# Patient Record
Sex: Male | Born: 1965
Health system: Southern US, Community
[De-identification: ages and names within clinical notes are randomized; demographics above are authoritative.]

## PROBLEM LIST (undated history)

## (undated) DIAGNOSIS — I472 Ventricular tachycardia: Secondary | ICD-10-CM

## (undated) DIAGNOSIS — F419 Anxiety disorder, unspecified: Secondary | ICD-10-CM

## (undated) DIAGNOSIS — I4891 Unspecified atrial fibrillation: Secondary | ICD-10-CM

## (undated) DIAGNOSIS — Z720 Tobacco use: Secondary | ICD-10-CM

## (undated) DIAGNOSIS — I493 Ventricular premature depolarization: Secondary | ICD-10-CM

## (undated) DIAGNOSIS — T7840XA Allergy, unspecified, initial encounter: Secondary | ICD-10-CM

## (undated) DIAGNOSIS — I4729 Other ventricular tachycardia: Secondary | ICD-10-CM

## (undated) DIAGNOSIS — I480 Paroxysmal atrial fibrillation: Secondary | ICD-10-CM

## (undated) HISTORY — DX: Ventricular tachycardia: I47.2

## (undated) HISTORY — DX: Tobacco use: Z72.0

## (undated) HISTORY — PX: WISDOM TOOTH EXTRACTION: SHX21

## (undated) HISTORY — DX: Paroxysmal atrial fibrillation: I48.0

## (undated) HISTORY — DX: Ventricular premature depolarization: I49.3

## (undated) HISTORY — DX: Other ventricular tachycardia: I47.29

## (undated) HISTORY — DX: Anxiety disorder, unspecified: F41.9

## (undated) HISTORY — PX: HERNIA REPAIR: SHX51

## (undated) HISTORY — DX: Allergy, unspecified, initial encounter: T78.40XA

---

## 2004-01-27 ENCOUNTER — Emergency Department (HOSPITAL_COMMUNITY): Admission: EM | Admit: 2004-01-27 | Discharge: 2004-01-28 | Payer: Self-pay

## 2009-08-20 ENCOUNTER — Encounter: Admission: RE | Admit: 2009-08-20 | Discharge: 2009-08-20 | Payer: Self-pay | Admitting: Internal Medicine

## 2011-08-28 ENCOUNTER — Ambulatory Visit (INDEPENDENT_AMBULATORY_CARE_PROVIDER_SITE_OTHER): Payer: 59 | Admitting: Family Medicine

## 2011-08-28 VITALS — BP 138/87 | HR 70 | Temp 97.9°F | Resp 16 | Ht 71.5 in | Wt 211.0 lb

## 2011-08-28 DIAGNOSIS — Z23 Encounter for immunization: Secondary | ICD-10-CM

## 2011-08-28 DIAGNOSIS — S61409A Unspecified open wound of unspecified hand, initial encounter: Secondary | ICD-10-CM

## 2011-08-28 DIAGNOSIS — S60569A Insect bite (nonvenomous) of unspecified hand, initial encounter: Secondary | ICD-10-CM

## 2011-08-28 DIAGNOSIS — T63331A Toxic effect of venom of brown recluse spider, accidental (unintentional), initial encounter: Secondary | ICD-10-CM

## 2011-08-28 DIAGNOSIS — F17200 Nicotine dependence, unspecified, uncomplicated: Secondary | ICD-10-CM

## 2011-08-28 DIAGNOSIS — L299 Pruritus, unspecified: Secondary | ICD-10-CM

## 2011-08-28 MED ORDER — CETIRIZINE HCL 10 MG PO TABS: ORAL_TABLET | ORAL | Status: DC

## 2011-08-28 MED ORDER — RANITIDINE HCL 300 MG PO TABS: 300.0000 mg | ORAL_TABLET | Freq: Every day | ORAL | Status: DC

## 2011-08-28 MED ORDER — CEPHALEXIN 500 MG PO CAPS: 500.0000 mg | ORAL_CAPSULE | Freq: Two times a day (BID) | ORAL | Status: AC

## 2011-08-28 NOTE — Progress Notes (Signed)
  Subjective:    Patient ID: Nathan Harrison, male    DOB: Oct 08, 1965, 46 y.o.   MRN: [id number]  HPI 46 YO cm PRESENTS WITH PRURITIC BLISTER IN l HAND.  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  Doesn't remember any injury.  Didn't see any unusual spiders.    Onset: 3-4 days ago Quality:  Itching Duration: 3-4 days ago. Timing:  Constant Assc S/Sx=hand feels tight, no pain.   Review of Systems  Constitutional: Negative.   Gastrointestinal: Negative.   Neurological: Negative.   All other systems reviewed and are negative.       Objective:   Physical Exam  Constitutional: He appears well-developed and well-nourished.  HENT:  Head: Normocephalic and atraumatic.  Neck: Normal range of motion.  Cardiovascular: Normal rate, regular rhythm and normal heart sounds.   Pulmonary/Chest: Effort normal and breath sounds normal.  Skin: Skin is warm and dry.       See photo        Assessment & Plan:  Bullae ? Spider bite vs. other

## 2011-08-28 NOTE — Patient Instructions (Signed)
Return to clinic immediately if red streaks develop, fever, skin appears to turn black, or pain develops.

## 2011-08-29 NOTE — Progress Notes (Signed)
Patient seen and examined with [facility]  Bullous blood filled bleb. Agree with treatment plan.

## 2011-08-30 ENCOUNTER — Emergency Department (HOSPITAL_BASED_OUTPATIENT_CLINIC_OR_DEPARTMENT_OTHER)
Admission: EM | Admit: 2011-08-30 | Discharge: 2011-08-31 | Disposition: A | Discharge status: Home / Self Care | Payer: 59 | Attending: Emergency Medicine | Admitting: Emergency Medicine

## 2011-08-30 ENCOUNTER — Encounter (HOSPITAL_BASED_OUTPATIENT_CLINIC_OR_DEPARTMENT_OTHER): Payer: Self-pay | Admitting: *Deleted

## 2011-08-30 DIAGNOSIS — T63391A Toxic effect of venom of other spider, accidental (unintentional), initial encounter: Secondary | ICD-10-CM | POA: Insufficient documentation

## 2011-08-30 DIAGNOSIS — T63301A Toxic effect of unspecified spider venom, accidental (unintentional), initial encounter: Secondary | ICD-10-CM

## 2011-08-30 DIAGNOSIS — T07XXXA Unspecified multiple injuries, initial encounter: Secondary | ICD-10-CM | POA: Insufficient documentation

## 2011-08-30 NOTE — ED Notes (Signed)
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 Last Thursday, he was seen at [facility] and ?diagnosed with a "Brown Recluse bite" to the palm of his left hand. Placed on Keflex, Zyrtec and Zantac. Now presents with a purplish blister 2-1/2 cm x 1-1/2 cm to the palm of his hand. Red around circumference. Pt states this started off as an itch, then 2 days later a blister, then this.

## 2011-08-31 NOTE — ED Provider Notes (Signed)
History    Scribed for Hanley Seamen, MD, the patient was seen in room MHH1/MHH1. This chart was scribed by Katha Cabal.   CSN: [id number]  Arrival date & time 08/30/11  2149   First MD Initiated Contact with Patient 08/31/11 0028      Chief Complaint  Patient presents with  . Insect Bite    (Consider location/radiation/quality/duration/timing/severity/associated sxs/prior treatment) HPI    Nathan Harrison is a 46 y.o. male who presents to the Emergency Department complaining of gradual worsening of moderate insect bite that was diagnosed 2 days ago at [place] as a brown recluse spider bite.    Patient reports itching.  Blister has grown in size the past 2 days.   Patient concerned about MRSA.  Patient unsure of how blister began.  Patient states he typically wears gloves but recalls not wearing gloves during particular unsanitary episode at work.   Patient noticed blister 2 days after incident.    History reviewed. No pertinent past medical history.  Past Surgical History  Procedure Date  . Wisdom tooth extraction   . Hernia repair     History reviewed. No pertinent family history.  History  Substance Use Topics  . Smoking status: Never Smoker   . Smokeless tobacco: Current User    Types: Snuff  . Alcohol Use: 7.2 oz/week    12 Cans of beer per week      Review of Systems  All other systems reviewed and are negative.    Allergies  Tuberculin tests  Home Medications   Current Outpatient Rx  Name Route Sig Dispense Refill  . CEPHALEXIN 500 MG PO CAPS Oral Take 1 capsule (500 mg total) by mouth 2 (two) times daily. 14 capsule 0  . CETIRIZINE HCL 10 MG PO TABS Oral Take 10 mg by mouth daily. For 2 weeks starting 08/29/11    . RANITIDINE HCL 300 MG PO TABS Oral Take 1 tablet (300 mg total) by mouth at bedtime. 30 tablet 0    BP 149/93  Pulse 82  Temp(Src) 98.5 F (36.9 C) (Oral)  Resp 20  Ht 5' 11.5" (1.816 m)  Wt 210 lb (95.255 kg)  BMI 28.88 kg/m2   SpO2 100%  Physical Exam General: Well-developed, well-nourished male in no acute distress; appearance consistent with age of record HENT: normocephalic, atraumatic Eyes: pupils equal round and reactive to light; extraocular muscles intact Neck: supple Heart: regular rate and rhythm; no murmurs, rubs or gallops Lungs: clear to auscultation bilaterally Abdomen: soft; nondistended; nontender; no masses or hepatosplenomegaly; bowel sounds present Extremities: No deformity; full range of motion; pulses normal Neurologic: Awake, alert and oriented; motor function intact in all extremities and symmetric; no facial droop Skin: Warm and dry, hemorrhagic bulla of palm of left hand with mild surrounding erythema but no warmth   Psychiatric: Normal mood and affect   ED Course  Procedures (including critical care time)   NEEDLE DRAINAGE Bulla prepped with Chlorascrub.  Bulla was punctured with 22 gauge needle.  Serous drainage obtained, there was no pus.     DIAGNOSTIC STUDIES: Oxygen Saturation is 100% on room air, normal by my interpretation.     COORDINATION OF CARE: 12:33 AM  Physical exam complete.   12:37 AM  Needle Drainage procedure tolerated well. 12:40 AM  Plan to discharge patient with spider bite.  Patient agrees with plan.       LABS / RADIOLOGY:   Labs Reviewed - No data to display No results  found.       MDM  I personally performed the services described in this documentation, which was scribed in my presence.  The recorded information has been reviewed and considered.        IMPRESSION: 1. Spider bite               Hanley Seamen, MD 08/31/11 [phone]

## 2012-06-25 ENCOUNTER — Ambulatory Visit (INDEPENDENT_AMBULATORY_CARE_PROVIDER_SITE_OTHER): Payer: 59 | Admitting: Emergency Medicine

## 2012-06-25 VITALS — BP 133/82 | HR 73 | Temp 98.0°F | Resp 16 | Ht 72.0 in | Wt 210.6 lb

## 2012-06-25 DIAGNOSIS — K4090 Unilateral inguinal hernia, without obstruction or gangrene, not specified as recurrent: Secondary | ICD-10-CM

## 2012-06-25 NOTE — Progress Notes (Signed)
[facility] [address], [place] [address] [phone]  Date:  06/25/2012   Name:  Nathan Harrison   DOB:  1965-11-29   MRN:  [id number]  PCP:  No primary provider on file.    Chief Complaint: Hernia   History of Present Illness:  Nathan Harrison is a 46 y.o. very pleasant male patient who presents with the following:  Last night developed a pain in his right groin after completing a workout.  Has a funny swollen feeling.  No testicular or scrotal involvement  There is no problem list on file for this patient.   Past Medical History  Diagnosis Date  . Allergy     Past Surgical History  Procedure Date  . Wisdom tooth extraction   . Hernia repair     History  Substance Use Topics  . Smoking status: Never Smoker   . Smokeless tobacco: Current User    Types: Snuff  . Alcohol Use: 7.2 oz/week    12 Cans of beer per week    Family History  Problem Relation Age of Onset  . Arthritis Mother   . Arthritis Father   . Hyperlipidemia Father   . Arthritis Brother     Allergies  Allergen Reactions  . Tuberculin Tests Other (See Comments)    Had adverse reaction in college    Medication list has been reviewed and updated.  Current Outpatient Prescriptions on File Prior to Visit  Medication Sig Dispense Refill  . cetirizine (ZYRTEC) 10 MG tablet Take 10 mg by mouth daily. For 2 weeks starting 08/29/11      . ranitidine (ZANTAC) 300 MG tablet Take 1 tablet (300 mg total) by mouth at bedtime.  30 tablet  0    Review of Systems:  As per HPI, otherwise negative.    Physical Examination: Filed Vitals:   06/25/12 1051  BP: 133/82  Pulse: 73  Temp: 98 F (36.7 C)  Resp: 16   Filed Vitals:   06/25/12 1051  Height: 6' (1.829 m)  Weight: 210 lb 9.6 oz (95.528 kg)   Body mass index is 28.56 kg/(m^2). Ideal Body Weight: Weight in (lb) to have BMI = 25: 183.9    GEN: WDWN, NAD, Non-toxic, Alert & Oriented x 3 HEENT: Atraumatic,  Normocephalic.  Ears and Nose: No external deformity. EXTR: No clubbing/cyanosis/edema NEURO: Normal gait.  PSYCH: Normally interactive. Conversant. Not depressed or anxious appearing.  Calm demeanor.  Genitalia: normal male circumcised.  Right inguinal hernia  Assessment and Plan: Inguinal hernia Surgical referral  Carmelina Dane, MD

## 2012-06-25 NOTE — Patient Instructions (Signed)

## 2012-06-28 ENCOUNTER — Telehealth: Payer: Self-pay

## 2012-06-28 ENCOUNTER — Encounter (HOSPITAL_BASED_OUTPATIENT_CLINIC_OR_DEPARTMENT_OTHER): Payer: Self-pay | Admitting: *Deleted

## 2012-06-28 ENCOUNTER — Emergency Department (HOSPITAL_BASED_OUTPATIENT_CLINIC_OR_DEPARTMENT_OTHER)
Admission: EM | Admit: 2012-06-28 | Discharge: 2012-06-28 | Disposition: A | Discharge status: Home / Self Care | Payer: Worker's Compensation | Attending: Emergency Medicine | Admitting: Emergency Medicine

## 2012-06-28 DIAGNOSIS — K4090 Unilateral inguinal hernia, without obstruction or gangrene, not specified as recurrent: Secondary | ICD-10-CM | POA: Insufficient documentation

## 2012-06-28 LAB — URINALYSIS, ROUTINE W REFLEX MICROSCOPIC
Glucose, UA: 100 mg/dL — AB
Ketones, ur: NEGATIVE mg/dL
Leukocytes, UA: NEGATIVE
Nitrite: NEGATIVE
Protein, ur: NEGATIVE mg/dL
Urobilinogen, UA: 0.2 mg/dL (ref 0.0–1.0)

## 2012-06-28 NOTE — ED Notes (Signed)
While at work on Thursday December 5th he was lifting a steel door and that night his right groin was swollen and painful. He was seen by his MD the next day for abdominal pain and inguinal hernia swelling. Today while he was at work he noticed swelling in his abdomen.

## 2012-06-28 NOTE — ED Notes (Signed)
MD at bedside. 

## 2012-06-28 NOTE — ED Provider Notes (Signed)
History  This chart was scribed for Charles B. Bernette Mayers, MD by Ardeen Jourdain, ED Scribe. This patient was seen in room MH09/MH09 and the patient's care was started at 2214.  CSN: [id number]  Arrival date & time 06/28/12  2155   First MD Initiated Contact with Patient 06/28/12 2214      Chief Complaint  Patient presents with  . Abdominal Pain     The history is provided by the patient. No language interpreter was used.    Nathan Harrison is a 46 y.o. male who presents to the Emergency Department complaining of abdominal pain with associated swelling of the groin area, right sided pain and abdominal tightness. He denies nausea and emesis as associated symptoms. He reports the pain has been gradually worsening today and is aggravated by all types of movement. He states the pain started 5 days ago in the evening after lifting heavy objects at work. He states he was seen at [facility]gent [facility] the next day for the pain and was diagnosed with a hernia. He states the physician "pushed the hernia back in" and reccommended follow up with a surgeon is the pain worsens or new symptoms appear.   Past Medical History  Diagnosis Date  . Allergy     Past Surgical History  Procedure Date  . Wisdom tooth extraction   . Hernia repair     Family History  Problem Relation Age of Onset  . Arthritis Mother   . Arthritis Father   . Hyperlipidemia Father   . Arthritis Brother     History  Substance Use Topics  . Smoking status: Never Smoker   . Smokeless tobacco: Current User    Types: Snuff  . Alcohol Use: 7.2 oz/week    12 Cans of beer per week      Review of Systems  All other systems reviewed and are negative.  A complete 10 system review of systems was obtained and all systems are negative except as noted in the HPI and PMH.    Allergies  Tuberculin tests  Home Medications   Current Outpatient Rx  Name  Route  Sig  Dispense  Refill  . CETIRIZINE HCL 10 MG PO TABS   Oral   Take  10 mg by mouth daily. For 2 weeks starting 08/29/11         . RANITIDINE HCL 300 MG PO TABS   Oral   Take 1 tablet (300 mg total) by mouth at bedtime.   30 tablet   0     Triage Vitals: BP 143/90  Pulse 99  Temp 98.2 F (36.8 C) (Oral)  Resp 18  SpO2 98%  Physical Exam  Nursing note and vitals reviewed. Constitutional: He is oriented to person, place, and time. He appears well-developed and well-nourished.  HENT:  Head: Normocephalic and atraumatic.  Eyes: EOM are normal. Pupils are equal, round, and reactive to light.  Neck: Normal range of motion. Neck supple.  Cardiovascular: Normal rate, regular rhythm, normal heart sounds and intact distal pulses.   Pulmonary/Chest: Effort normal and breath sounds normal.  Abdominal: Soft. Bowel sounds are normal. He exhibits no distension. There is no tenderness. There is no rebound and no guarding.  Genitourinary:       Mild tenderness to R testicle without significant swelling, there is a small R inguinal hernia without bowel contents, no incarceration  Musculoskeletal: Normal range of motion. He exhibits no edema and no tenderness.  Neurological: He is alert and oriented  to person, place, and time. He has normal strength. No cranial nerve deficit or sensory deficit.  Skin: Skin is warm and dry. No rash noted.  Psychiatric: He has a normal mood and affect. His behavior is normal.    ED Course  Procedures (including critical care time)  DIAGNOSTIC STUDIES: Oxygen Saturation is 98% on room air , normal by my interpretation.    COORDINATION OF CARE:  10:23 PM: Discussed treatment plan which includes a urinalysis with pt at bedside and pt agreed to plan.   Results for orders placed during the hospital encounter of 06/28/12  URINALYSIS, ROUTINE W REFLEX MICROSCOPIC      Component Value Range   Color, Urine YELLOW  YELLOW   APPearance CLEAR  CLEAR   Specific Gravity, Urine 1.024  1.005 - 1.030   pH 5.0  5.0 - 8.0   Glucose, UA  100 (*) NEGATIVE mg/dL   Hgb urine dipstick NEGATIVE  NEGATIVE   Bilirubin Urine NEGATIVE  NEGATIVE   Ketones, ur NEGATIVE  NEGATIVE mg/dL   Protein, ur NEGATIVE  NEGATIVE mg/dL   Urobilinogen, UA 0.2  0.0 - 1.0 mg/dL   Nitrite NEGATIVE  NEGATIVE   Leukocytes, UA NEGATIVE  NEGATIVE    No results found.   No diagnosis found.    MDM  Inguinal hernia without incarceration, advised Gen Surg followup for further evaluation and management.     I personally performed the services described in this documentation, which was scribed in my presence. The recorded information has been reviewed and is accurate.     Charles B. Bernette Mayers, MD 06/28/12 [phone]

## 2012-06-28 NOTE — Telephone Encounter (Signed)
Patient has some questions and concerns about hernia. He was told to use best judgement when lifting at work and go to ER if things get worse, he would like a bit more clarification on this. He is wondering what to do while he waits for surgery to be scheduled because he still needs to work.  Best [phone]

## 2012-06-28 NOTE — Progress Notes (Signed)
Reviewed and agree.

## 2012-06-29 ENCOUNTER — Ambulatory Visit: Payer: 59 | Admitting: Family Medicine

## 2012-06-29 VITALS — BP 122/84 | HR 77 | Temp 98.0°F | Resp 16 | Ht 72.0 in | Wt 210.0 lb

## 2012-06-29 DIAGNOSIS — K4090 Unilateral inguinal hernia, without obstruction or gangrene, not specified as recurrent: Secondary | ICD-10-CM

## 2012-06-29 NOTE — Patient Instructions (Signed)
Nathan Harrison is a 46 y.o. very pleasant male patient who presents with the following:  Worked in [employer]. 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  He recalls lifting a heavy door and this is when the problem began.  Last night developed a pain in his right groin after completing a workout. Has a funny swollen feeling. No testicular or scrotal involvement  He has been trying to take it easy at work, and has been checking himself frequently and trying to reduce the hernia during the day.  He is worried about worsening hernia because of frequent urination and right (ipsilateral) flank pain.  He went to the ED last night.  The hernia was confirmed.  S/P double hernia repair as child in [place].  Objective:  We discussed the nature of hernias. Right hernia barely palpable  Assessment:  Right inguinal hernia.  Plan: surgical referral on worker's comp

## 2012-06-29 NOTE — Telephone Encounter (Signed)
Pt came into 102 this morning to be seen and he is here now for recheck and to get instr's from provider.

## 2012-06-29 NOTE — Progress Notes (Signed)
Nathan Harrison is a 46 y.o. 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  He recalls lifting a heavy door and this is when the problem began.  Last night developed a pain in his right groin after completing a workout. Has a funny swollen feeling. No testicular or scrotal involvement  He has been trying to take it easy at work, and has been checking himself frequently and trying to reduce the hernia during the day.  He is worried about worsening hernia because of frequent urination and right (ipsilateral) flank pain.  He went to the ED last night.  The hernia was confirmed.  S/P double hernia repair as child in [place].  Objective:  We discussed the nature of hernias. Right hernia barely palpable  Assessment:  Right inguinal hernia.  Plan: surgical referral on worker's comp 

## 2012-07-01 ENCOUNTER — Encounter (INDEPENDENT_AMBULATORY_CARE_PROVIDER_SITE_OTHER): Payer: Self-pay | Admitting: Surgery

## 2012-07-02 ENCOUNTER — Ambulatory Visit (INDEPENDENT_AMBULATORY_CARE_PROVIDER_SITE_OTHER): Payer: Worker's Compensation | Admitting: Surgery

## 2012-07-02 ENCOUNTER — Encounter (INDEPENDENT_AMBULATORY_CARE_PROVIDER_SITE_OTHER): Payer: Self-pay | Admitting: Surgery

## 2012-07-02 VITALS — BP 127/86 | HR 80 | Temp 98.2°F | Resp 14 | Ht 69.0 in | Wt 211.8 lb

## 2012-07-02 DIAGNOSIS — K4020 Bilateral inguinal hernia, without obstruction or gangrene, not specified as recurrent: Secondary | ICD-10-CM | POA: Insufficient documentation

## 2012-07-02 DIAGNOSIS — K429 Umbilical hernia without obstruction or gangrene: Secondary | ICD-10-CM

## 2012-07-02 NOTE — Progress Notes (Signed)
Patient ID: Nathan Harrison, male   DOB: [birth year], 46 y.o.   MRN: [id number]  Chief Complaint  Patient presents with  . Other    possible right inguinal hernia    HPI Nathan Harrison is a 46 y.o. male.   HPI This is a very pleasant gentleman referred by Dr. Milus Glazier for evaluation of a symptomatic right inguinal hernia. He started having swelling in the right groin and pain after heavy lifting on the job. He then noticed a bulge that could be reduced.  This was confirmed by his primary care physician. He has had no obstructive symptoms. He reports minimal discomfort now. He had bilateral inguinal hernia repairs as a baby. He is otherwise healthy Past Medical History  Diagnosis Date  . Allergy     Past Surgical History  Procedure Date  . Wisdom tooth extraction   . Hernia repair     Family History  Problem Relation Age of Onset  . Arthritis Mother   . Arthritis Father   . Hyperlipidemia Father   . Arthritis Brother     Social History History  Substance Use Topics  . Smoking status: Never Smoker   . Smokeless tobacco: Current User    Types: Snuff  . Alcohol Use: 7.2 oz/week    12 Cans of beer per week    Allergies  Allergen Reactions  . Tuberculin Tests Other (See Comments)    Had adverse reaction in college    No current outpatient prescriptions on file.    Review of Systems Review of Systems  Constitutional: Negative for fever, chills and unexpected weight change.  HENT: Negative for hearing loss, congestion, sore throat, trouble swallowing and voice change.   Eyes: Negative for visual disturbance.  Respiratory: Negative for cough and wheezing.   Cardiovascular: Negative for chest pain, palpitations and leg swelling.  Gastrointestinal: Negative for nausea, vomiting, abdominal pain, diarrhea, constipation, blood in stool, abdominal distention, anal bleeding and rectal pain.  Genitourinary: Negative for hematuria and difficulty urinating.   Musculoskeletal: Negative for arthralgias.  Skin: Negative for rash and wound.  Neurological: Negative for seizures, syncope, weakness and headaches.  Hematological: Negative for adenopathy. Does not bruise/bleed easily.  Psychiatric/Behavioral: Negative for confusion.    Blood pressure 127/86, pulse 80, temperature 98.2 F (36.8 C), temperature source Temporal, resp. rate 14, height 5\' 9"  (1.753 m), weight 211 lb 12.8 oz (96.072 kg).  Physical Exam Physical Exam  Constitutional: He is oriented to person, place, and time. He appears well-developed and well-nourished. No distress.  HENT:  Head: Normocephalic and atraumatic.  Right Ear: External ear normal.  Left Ear: External ear normal.  Nose: Nose normal.  Mouth/Throat: Oropharynx is clear and moist. No oropharyngeal exudate.  Eyes: Conjunctivae normal are normal. Pupils are equal, round, and reactive to light. Right eye exhibits no discharge. Left eye exhibits no discharge. No scleral icterus.  Neck: Normal range of motion. Neck supple. No tracheal deviation present. No thyromegaly present.  Cardiovascular: Normal rate, regular rhythm, normal heart sounds and intact distal pulses.   No murmur heard. Pulmonary/Chest: Effort normal and breath sounds normal. No respiratory distress. He has no wheezes. He has no rales.  Abdominal: Soft. Bowel sounds are normal. He exhibits no distension. There is no tenderness. There is no guarding.       He has a moderate-sized, reducible right inguinal hernia. There is a very small left inguinal hernia. He also has a chronically incarcerated umbilical hernia  Musculoskeletal: Normal range of motion.  He exhibits no edema and no tenderness.  Lymphadenopathy:    He has no cervical adenopathy.  Neurological: He is alert and oriented to person, place, and time.  Skin: Skin is warm and dry. No rash noted. He is not diaphoretic. No erythema.  Psychiatric: His behavior is normal. Judgment normal.    Data  Reviewed   Assessment    Bilateral inguinal hernias and umbilical hernia    Plan    Repair with mesh was recommended. I discussed with the laparoscopic and open techniques. As it is bilateral and there is a small hernia at the umbilicus, I would recommend laparoscopic repair with mesh. I discussed the risks of surgery which includes but is not limited to bleeding, infection, chronic pain, nerve entrapment, recurrence, etc. He understands and wishes to proceed. Likelihood of success is good       Marquetta Weiskopf A 07/02/2012, 3:09 PM

## 2012-07-06 ENCOUNTER — Ambulatory Visit (INDEPENDENT_AMBULATORY_CARE_PROVIDER_SITE_OTHER): Payer: 59 | Admitting: Surgery

## 2012-07-28 ENCOUNTER — Telehealth (INDEPENDENT_AMBULATORY_CARE_PROVIDER_SITE_OTHER): Payer: Self-pay | Admitting: General Surgery

## 2012-07-28 DIAGNOSIS — K4020 Bilateral inguinal hernia, without obstruction or gangrene, not specified as recurrent: Secondary | ICD-10-CM

## 2012-07-28 NOTE — Telephone Encounter (Signed)
Called to note a small amount of blood and burning with urination.  I stated this was most likely from catheter plament and that it should get better with time.  i instructed to call back with profuse  Bleeding, passing clots or inability to urinate.

## 2012-07-29 ENCOUNTER — Telehealth (INDEPENDENT_AMBULATORY_CARE_PROVIDER_SITE_OTHER): Payer: Self-pay | Admitting: General Surgery

## 2012-07-29 ENCOUNTER — Telehealth (INDEPENDENT_AMBULATORY_CARE_PROVIDER_SITE_OTHER): Payer: Self-pay

## 2012-07-29 NOTE — Telephone Encounter (Signed)
His wife called about a small amount of blood in urine when starting and stopping voiding.  It is not a significant amount.  I explained that this is most likely from the foley and should resolve by today.  If still having symptoms by this afternoon, I recommended that she call the office again to let us know and get further direction. She agreed to call us later if symptoms still persist.

## 2012-07-29 NOTE — Telephone Encounter (Signed)
I called to check on the pt and schedule a postop appointment.  He said he had spoken to someone about having some blood when he urinates.  I read the chart and agreed with the physician that it was probably irritation from the foley catheter.  I told him it should get better hopefully today.  I asked him to call if it is no better or if there is pain or burning.  He said his thigh is numb and I told him that is normal and will get better.  He asked if he can stand up and walk around or will it mess something up.  I told him walking is definitely good and he should not sit too long and run the risk of blood clots.  He will call with anymore concerns.

## 2012-08-02 ENCOUNTER — Encounter (INDEPENDENT_AMBULATORY_CARE_PROVIDER_SITE_OTHER): Payer: Self-pay | Admitting: Surgery

## 2012-08-02 ENCOUNTER — Ambulatory Visit (INDEPENDENT_AMBULATORY_CARE_PROVIDER_SITE_OTHER): Payer: Worker's Compensation | Admitting: Surgery

## 2012-08-02 VITALS — BP 142/80 | HR 76 | Temp 97.5°F | Resp 16 | Ht 72.0 in | Wt 205.4 lb

## 2012-08-02 DIAGNOSIS — Z09 Encounter for follow-up examination after completed treatment for conditions other than malignant neoplasm: Secondary | ICD-10-CM

## 2012-08-02 NOTE — Progress Notes (Signed)
Subjective:     Patient ID: Nathan Harrison, male   DOB: September 24, 1965, 47 y.o.   MRN: [id number]  HPI He is here today for an early postop visit. He was concerned about some bruising. He is otherwise doing well and is not taking any pain medication  Review of Systems     Objective:   Physical Exam On exam, there is some very mild bruising right laterally. His abdomen is soft and nontender and there is no evidence of recurrent hernias    Assessment:     Patient stable postop    Plan:     He will continue to refrain from heavy lifting. I will see him back on his regular scheduled postop visit

## 2012-08-17 ENCOUNTER — Ambulatory Visit (INDEPENDENT_AMBULATORY_CARE_PROVIDER_SITE_OTHER): Payer: Worker's Compensation | Admitting: Surgery

## 2012-08-17 ENCOUNTER — Encounter (INDEPENDENT_AMBULATORY_CARE_PROVIDER_SITE_OTHER): Payer: Self-pay | Admitting: Surgery

## 2012-08-17 VITALS — BP 120/81 | HR 70 | Temp 97.3°F | Resp 18 | Ht 72.0 in | Wt 206.6 lb

## 2012-08-17 DIAGNOSIS — Z09 Encounter for follow-up examination after completed treatment for conditions other than malignant neoplasm: Secondary | ICD-10-CM

## 2012-08-17 NOTE — Progress Notes (Signed)
Subjective:     Patient ID: Nathan Harrison, male   DOB: 1966/06/05, 47 y.o.   MRN: [id number]  HPI He is here for his first postop visit status post bilateral laparoscopic inguinal hernia repair with mesh approximately 2 weeks ago. He has mild discomfort but is otherwise doing well. He does have some irritation from on his tongue and floor of his mouth from the endotracheal tube  Review of Systems     Objective:   Physical Exam On exam, there is no evidence recurrent hernia    Assessment:     Patient stable postop    Plan:     He may resume normal activity efforts we can. I will see him back as needed

## 2012-08-19 ENCOUNTER — Telehealth (INDEPENDENT_AMBULATORY_CARE_PROVIDER_SITE_OTHER): Payer: Self-pay

## 2012-08-19 NOTE — Telephone Encounter (Signed)
Irving Burton from pharmacy called to clarify dosage on nystatin powder that was written on 1-28 at office visit. I advised her I will send msg to Dr Magnus Ivan and his assistant to get dosage and call her back. Call back # is [phone].

## 2012-08-19 NOTE — Telephone Encounter (Signed)
Whatever pharm recommends for dose of nystatin swish and swallow

## 2012-08-20 ENCOUNTER — Telehealth (INDEPENDENT_AMBULATORY_CARE_PROVIDER_SITE_OTHER): Payer: Self-pay | Admitting: General Surgery

## 2012-08-20 NOTE — Telephone Encounter (Signed)
Called back to the [facility] and spoke to the pharmacy Erin and I told him what Dr Magnus Ivan stated to do whatever the pharmacy on the dosage for the nystatin swish and swallow. Denny Peon will do the 10 ml on the nystatin

## 2012-09-08 ENCOUNTER — Emergency Department (HOSPITAL_BASED_OUTPATIENT_CLINIC_OR_DEPARTMENT_OTHER)
Admission: EM | Admit: 2012-09-08 | Discharge: 2012-09-09 | Disposition: A | Discharge status: Home / Self Care | Payer: 59 | Attending: Emergency Medicine | Admitting: Emergency Medicine

## 2012-09-08 ENCOUNTER — Encounter (HOSPITAL_BASED_OUTPATIENT_CLINIC_OR_DEPARTMENT_OTHER): Payer: Self-pay | Admitting: *Deleted

## 2012-09-08 DIAGNOSIS — R0602 Shortness of breath: Secondary | ICD-10-CM | POA: Insufficient documentation

## 2012-09-08 DIAGNOSIS — Z716 Tobacco abuse counseling: Secondary | ICD-10-CM

## 2012-09-08 DIAGNOSIS — R110 Nausea: Secondary | ICD-10-CM | POA: Insufficient documentation

## 2012-09-08 DIAGNOSIS — R079 Chest pain, unspecified: Secondary | ICD-10-CM | POA: Insufficient documentation

## 2012-09-08 DIAGNOSIS — F17200 Nicotine dependence, unspecified, uncomplicated: Secondary | ICD-10-CM | POA: Insufficient documentation

## 2012-09-08 DIAGNOSIS — R1013 Epigastric pain: Secondary | ICD-10-CM | POA: Insufficient documentation

## 2012-09-08 NOTE — ED Notes (Signed)
Pt c/o Cp pressure with nausea after lengthy walk this pm

## 2012-09-08 NOTE — ED Provider Notes (Signed)
History     CSN: [id number]  Arrival date & time 09/08/12  2219   First MD Initiated Contact with Patient 09/08/12 2349      Chief Complaint  Patient presents with  . Chest Pain    (Consider location/radiation/quality/duration/timing/severity/associated sxs/prior treatment) HPI Nathan Harrison is a 47 y.o. male is no prior history of hypertension, hypercholesterolemia, is not diabetic but does dip tobacco presents with chest pain. Patient arrives with his father, there is no history of coronary artery disease or myocardial infarction in any first degree relatives. Mother has a history of atrial fibrillation or mitral valve prolapse. The pain started earlier approximately 7 to 8:00, the patient had just walked approximately 30 minutes with his son and had no pain with walking. Patient came inside his house and felt queasy prior to dinner, he sat on the couch and watching TV hoping the feeling would pass however he started having some neck pain which then went away and then had some indigestion in the epigastrium and center of his chest he felt like something was crawling up, he felt a dull pain in the center of his chest also describes it as "expanding, tightness, 3-4/10."  He said his hands felt clammy but he was not sweating, and he did feel short of breath during the episode of pain. Patient became concerned and called EMS. Patient was evaluated by EMS and didn't think he require transport. Patient arrived by private vehicle. Patient says the pain lasted for about 10 minutes and got better after EMS arrival. Currently the patient is pain-free. Patient's history is pertinent for bilateral inguinal hernia repairs about 3-4 weeks ago as well as an umbilical hernia repair. Patient denies any hemoptysis or shortness of breath prior to this incident.  Patient says he is bit since being a kid and uses one can a day, drinks about 12 cans of beer per week and denies any other illicit drugs.    Past  Medical History  Diagnosis Date  . Allergy     Past Surgical History  Procedure Laterality Date  . Wisdom tooth extraction    . Hernia repair      Family History  Problem Relation Age of Onset  . Arthritis Mother   . Arthritis Father   . Hyperlipidemia Father   . Arthritis Brother     History  Substance Use Topics  . Smoking status: Never Smoker   . Smokeless tobacco: Current User    Types: Snuff  . Alcohol Use: 7.2 oz/week    12 Cans of beer per week      Review of Systems. At least 10pt or greater review of systems completed and are negative except where specified in the HPI.  Allergies  Tuberculin tests  Home Medications  No current outpatient prescriptions on file.  BP 154/95  Pulse 77  Temp(Src) 98.1 F (36.7 C) (Oral)  Resp 16  Ht 6' (1.829 m)  Wt 200 lb (90.719 kg)  BMI 27.12 kg/m2  SpO2 100%  Physical Exam  Nursing notes reviewed.  Electronic medical record reviewed. VITAL SIGNS:   Filed Vitals:   09/08/12 2227 09/09/12 0302  BP: 154/95 114/77  Pulse: 77 60  Temp: 98.1 F (36.7 C) 98 F (36.7 C)  TempSrc: Oral Oral  Resp: 16 18  Height: 6' (1.829 m)   Weight: 200 lb (90.719 kg)   SpO2: 100% 100%   CONSTITUTIONAL: Awake, oriented x4, appears non-toxic HENT: Atraumatic, normocephalic, oral mucosa pink and moist, airway  patent. Nares patent without drainage. External ears normal. EYES: Conjunctiva clear, EOMI, PERRLA NECK: Trachea midline, non-tender, supple CARDIOVASCULAR: Normal heart rate, Normal rhythm, No murmurs, rubs, gallops PULMONARY/CHEST: Clear to auscultation, no rhonchi, wheezes, or rales. Symmetrical breath sounds. Non-tender. ABDOMINAL: Non-distended, overweight, soft, non-tender - no rebound or guarding.  BS normal. NEUROLOGIC: Non-focal, moving all four extremities, no gross sensory or motor deficits. EXTREMITIES: No clubbing, cyanosis, or edema SKIN: Warm, Dry, No erythema, No rash  Well healed lap scars from surg,  overwieght, o/w unremark  ED Course  Procedures (including critical care time)  Date: 09/09/2012 at 2241  Rate: 71  Rhythm: normal sinus rhythm  QRS Axis: normal  Intervals: normal  ST/T Wave abnormalities: normal  Conduction Disutrbances: none  Narrative Interpretation: unremarkable - normal sinus rhythm no prior available for comparison   Date: 09/09/2012  Rate: 60  Rhythm: normal sinus rhythm  QRS Axis: normal  Intervals: normal  ST/T Wave abnormalities: normal  Conduction Disutrbances: none  Narrative Interpretation: unremarkable - unchanged from prior EKG from earlier this evening   Labs Reviewed  BASIC METABOLIC PANEL - Abnormal; Notable for the following:    Glucose, Bld 114 (*)    GFR calc non Af Amer 89 (*)    All other components within normal limits  TROPONIN I  CBC WITH DIFFERENTIAL  TROPONIN I   Dg Chest 2 View  09/09/2012  *RADIOLOGY REPORT*  Clinical Data: Chest pain  CHEST - 2 VIEW  Comparison: Prior chest x-ray 01/28/2004  Findings: The lungs are well-aerated and free from pulmonary edema, focal airspace consolidation or pulmonary nodule.  Cardiac and mediastinal contours are within normal limits.  No pneumothorax, or pleural effusion. No acute osseous findings.  IMPRESSION:  No acute cardiopulmonary disease.   Original Report Authenticated By: Malachy Moan, M.D.     1. Chest pain   2. Shortness of breath   3. Epigastric pain       MDM  [name] is a 47 y.o. male With history of intermittent indigestion, no history of coronary artery disease, negative family history, presents with chest pain in the center of his chest. The chest pain described is atypical, also been associated with some epigastric pain suggesting possible acid indigestion, esophageal spasm.  The patient however appears concerned that there is something more serious going on, patient is low risk for pulmonary embolism however he has recently had surgery, but did deny any  shortness of breath currently and only had some shortness of breath when his pain was present. We'll obtain a d-dimer to lower posttest probability of PE.  Patient has not been ill recently, and with his chest pain waxing and waning, is not positional it do not think he is suffering from pericarditis, do not think he's got an aortic dissection as he is nontoxic and pain-free at this time. His pressure is stable and within normal limits. Is not tachycardic. Likewise there is no evidence for pneumothorax/tension pneumothorax do not think he's got a perforated viscus. Acute coronary syndrome remains a possibility however I do think this is unlikely at this time. Think this patient's pain is most likely related to gastroesophageal reflux disease or esophageal spasm. He denies any vomiting so do not think this is of Mallory-Weiss syndrome, no history of prior ulcers-he does drink fairly frequently so this could be related to an alcoholic gastritis. There is no relationship to eating I do not think this is biliary disease pancreatitis or functional gastrointestinal pain. Patient did  seize had problems with his neck which would explain the problem he had with his cervical pain earlier. There is no rash evident, do not think any of his pain is related to herpes zoster. Patient's pain is not reproducible I do not think it's related to chest wall pain. There is a possibility that he is would have mitral valve prolapse however he has absolutely no murmurs on physical exam - as such I also think aortic stenosis or hypertrophic obstructive cardiomyopathy or aortic insufficiency are unlikely. No history of productive cough or recent illness again making a pneumonia, pleuritis or bronchitis much less likely.  Patient has had 2 negative EKGs which are perfectly normal, sinus rhythm. Patient has 2 troponins which were both within normal limits with no change. Otherwise labs and chest x-ray as interpreted by a radiologist are  completely normal.  Since being in the emergency department, the patient has been pain-free.  Patient is looking to find a primary care physician in this building and does have a physician he is seen previously at [facility] urgent care. I think this patient is sufficiently low risk for cardiac disease he does not require followup stress testing in 2-3 days.  The only risk factor this patient has his long-term use of tobacco, his mother has a history of mitral valve prolapse (it sounds like) and atrial fibrillation.  Patient has had no palpitations.  I think followup with her primary care physician is adequate, patient can use TUMS or Mylanta as needed for occasional indigestion or possible esophageal spasm which I feel he is likely having tonight. Given explicit return instructions in case he is having recurrent chest pain he should return to the ER for reevaluation.  Have spent extensive time at the bedside counseling the patient smoking cessation including use of nicotine replacement products, increased success with repeated quitting attempts and other methods to insure he can quit using tobacco. Also spent time discussing diet and exercise as a way to manage his weight.  Patient's blood pressure is mildly elevated tonight, typically he says he has a systolic blood pressure approximately 130. I do not think there is any medical indication for starting hydrochlorothiazide this evening, he can followup with his primary care physician prior to initiating medication therapy-we discussed lifestyle modification at length I think that should be the first step.  I explained the diagnosis and have given explicit precautions to return to the ER including sustained chest pain, worsening chest pain any ranges in pain her pain does not go way with medicine or any other new or worsening symptoms. The patient understands and accepts the medical plan as it's been dictated and I have answered their questions. Discharge  instructions concerning home care and prescriptions have been given.  The patient is STABLE and is discharged to home in good condition.             Jones Skene, MD 09/09/12 [phone]

## 2012-09-09 ENCOUNTER — Emergency Department (HOSPITAL_BASED_OUTPATIENT_CLINIC_OR_DEPARTMENT_OTHER): Payer: 59

## 2012-09-09 LAB — CBC WITH DIFFERENTIAL/PLATELET
Basophils Absolute: 0 10*3/uL (ref 0.0–0.1)
Eosinophils Relative: 1 % (ref 0–5)
Lymphocytes Relative: 34 % (ref 12–46)
Neutro Abs: 5.1 10*3/uL (ref 1.7–7.7)
Neutrophils Relative %: 58 % (ref 43–77)

## 2012-09-09 LAB — BASIC METABOLIC PANEL
BUN: 21 mg/dL (ref 6–23)
Chloride: 105 mEq/L (ref 96–112)
GFR calc Af Amer: >90 mL/min (ref >90)
Glucose, Bld: 114 mg/dL — ABNORMAL HIGH (ref 70–99)
Potassium: 3.8 mEq/L (ref 3.5–5.1)

## 2012-09-09 MED ORDER — ALUM & MAG HYDROXIDE-SIMETH 200-200-20 MG/5ML PO SUSP
30.0000 mL | Freq: Once | ORAL | Status: DC
  Filled 2012-09-09: qty 30

## 2012-09-09 MED ORDER — ALUM & MAG HYDROXIDE-SIMETH 200-200-20 MG/5ML PO SUSP: 30.0000 mL | Freq: Once | ORAL | Status: DC

## 2012-09-10 ENCOUNTER — Telehealth (HOSPITAL_COMMUNITY): Payer: Self-pay | Admitting: *Deleted

## 2012-09-11 NOTE — ED Notes (Signed)
Directions for alum & mag hydroxide-simeth (MAALOX/MYLANTA) 200-200-20 MG/5ML suspension per NP Felicie Morn are take 20ml PO BID prn.  This was called into [facility] at [phone]

## 2012-09-23 ENCOUNTER — Telehealth: Payer: Self-pay

## 2012-09-23 NOTE — Telephone Encounter (Signed)
Nathan Harrison wife Nathan Harrison called stating that she thinks her husband was bit by a spider. She would like a call back she has a few questions regarding this matter. Please call at [phone]. Thanks

## 2012-09-25 NOTE — Telephone Encounter (Signed)
She states he is okay now. He did feel something bite him. Did not see what it was. She was grateful for the call.

## 2013-01-04 ENCOUNTER — Ambulatory Visit (INDEPENDENT_AMBULATORY_CARE_PROVIDER_SITE_OTHER): Payer: 59 | Admitting: Emergency Medicine

## 2013-01-04 VITALS — BP 144/92 | HR 71 | Temp 98.6°F | Resp 18 | Wt 210.0 lb

## 2013-01-04 DIAGNOSIS — R1013 Epigastric pain: Secondary | ICD-10-CM

## 2013-01-04 DIAGNOSIS — K3189 Other diseases of stomach and duodenum: Secondary | ICD-10-CM

## 2013-01-04 MED ORDER — ESOMEPRAZOLE MAGNESIUM 40 MG PO CPDR: 40.0000 mg | DELAYED_RELEASE_CAPSULE | Freq: Every day | ORAL | Status: DC

## 2013-01-04 NOTE — Patient Instructions (Addendum)
Diet for Gastroesophageal Reflux Disease, Adult Reflux (acid reflux) is when acid from your stomach flows up into the esophagus. When acid comes in contact with the esophagus, the acid causes irritation and soreness (inflammation) in the esophagus. When reflux happens often or so severely that it causes damage to the esophagus, it is called gastroesophageal reflux disease (GERD). Nutrition therapy can help ease the discomfort of GERD. FOODS OR DRINKS TO AVOID OR LIMIT  Smoking or chewing tobacco. Nicotine is one of the most potent stimulants to acid production in the gastrointestinal tract.  Caffeinated and decaffeinated coffee and black tea.  Regular or low-calorie carbonated beverages or energy drinks (caffeine-free carbonated beverages are allowed).   Strong spices, such as black pepper, white pepper, red pepper, cayenne, curry powder, and chili powder.  Peppermint or spearmint.  Chocolate.  High-fat foods, including meats and fried foods. Extra added fats including oils, butter, salad dressings, and nuts. Limit these to less than 8 tsp per day.  Fruits and vegetables if they are not tolerated, such as citrus fruits or tomatoes.  Alcohol.  Any food that seems to aggravate your condition. If you have questions regarding your diet, call your caregiver or a registered dietitian. OTHER THINGS THAT MAY HELP GERD INCLUDE:   Eating your meals slowly, in a relaxed setting.  Eating 5 to 6 small meals per day instead of 3 large meals.  Eliminating food for a period of time if it causes distress.  Not lying down until 3 hours after eating a meal.  Keeping the head of your bed raised 6 to 9 inches (15 to 23 cm) by using a foam wedge or blocks under the legs of the bed. Lying flat may make symptoms worse.  Being physically active. Weight loss may be helpful in reducing reflux in overweight or obese adults.  Wear loose fitting clothing EXAMPLE MEAL PLAN This meal plan is approximately  2,000 calories based on ChooseMy[employer]ate.gov meal planning guidelines. Breakfast   cup cooked oatmeal.  1 cup strawberries.  1 cup low-fat milk.  1 oz almonds. Snack  1 cup cucumber slices.  6 oz yogurt (made from low-fat or fat-free milk). Lunch  2 slice whole-wheat bread.  2 oz sliced turkey.  2 tsp mayonnaise.  1 cup blueberries.  1 cup snap peas. Snack  6 whole-wheat crackers.  1 oz string cheese. Dinner   cup brown rice.  1 cup mixed veggies.  1 tsp olive oil.  3 oz grilled fish. Document Released: 07/07/2005 Document Revised: 09/29/2011 Document Reviewed: 05/23/2011 ExitCare Patient Information 2014 [employer]. Gastroesophageal Reflux Disease, Adult Gastroesophageal reflux disease (GERD) happens when acid from your stomach flows up into the esophagus. When acid comes in contact with the esophagus, the acid causes soreness (inflammation) in the esophagus. Over time, GERD may create small holes (ulcers) in the lining of the esophagus. CAUSES   Increased body weight. This puts pressure on the stomach, making acid rise from the stomach into the esophagus.  Smoking. This increases acid production in the stomach.  Drinking alcohol. This causes decreased pressure in the lower esophageal sphincter (valve or ring of muscle between the esophagus and stomach), allowing acid from the stomach into the esophagus.  Late evening meals and a full stomach. This increases pressure and acid production in the stomach.  A malformed lower esophageal sphincter. Sometimes, no cause is found. SYMPTOMS   Burning pain in the lower part of the mid-chest behind the breastbone and in the mid-stomach area. This may   occur twice a week or more often.  Trouble swallowing.  Sore throat.  Dry cough.  Asthma-like symptoms including chest tightness, shortness of breath, or wheezing. DIAGNOSIS  Your caregiver may be able to diagnose GERD based on your symptoms. In some cases,  X-rays and other tests may be done to check for complications or to check the condition of your stomach and esophagus. TREATMENT  Your caregiver may recommend over-the-counter or prescription medicines to help decrease acid production. Ask your caregiver before starting or adding any new medicines.  HOME CARE INSTRUCTIONS   Change the factors that you can control. Ask your caregiver for guidance concerning weight loss, quitting smoking, and alcohol consumption.  Avoid foods and drinks that make your symptoms worse, such as:  Caffeine or alcoholic drinks.  Chocolate.  Peppermint or mint flavorings.  Garlic and onions.  Spicy foods.  Citrus fruits, such as oranges, lemons, or limes.  Tomato-based foods such as sauce, chili, salsa, and pizza.  Fried and fatty foods.  Avoid lying down for the 3 hours prior to your bedtime or prior to taking a nap.  Eat small, frequent meals instead of large meals.  Wear loose-fitting clothing. Do not wear anything tight around your waist that causes pressure on your stomach.  Raise the head of your bed 6 to 8 inches with wood blocks to help you sleep. Extra pillows will not help.  Only take over-the-counter or prescription medicines for pain, discomfort, or fever as directed by your caregiver.  Do not take aspirin, ibuprofen, or other nonsteroidal anti-inflammatory drugs (NSAIDs). SEEK IMMEDIATE MEDICAL CARE IF:   You have pain in your arms, neck, jaw, teeth, or back.  Your pain increases or changes in intensity or duration.  You develop nausea, vomiting, or sweating (diaphoresis).  You develop shortness of breath, or you faint.  Your vomit is green, yellow, black, or looks like coffee grounds or blood.  Your stool is red, bloody, or black. These symptoms could be signs of other problems, such as heart disease, gastric bleeding, or esophageal bleeding. MAKE SURE YOU:   Understand these instructions.  Will watch your  condition.  Will get help right away if you are not doing well or get worse. Document Released: 04/16/2005 Document Revised: 09/29/2011 Document Reviewed: 01/24/2011 ExitCare Patient Information 2014 [employer].  

## 2013-01-04 NOTE — Progress Notes (Signed)
[facility] [address] [place] [address] [phone]  Date:  01/04/2013   Name:  Nathan Harrison   DOB:  [birth year]   MRN:  [id number]  PCP:  Elvina Sidle, MD    Chief Complaint: Dysphagia   History of Present Illness:  Nathan Harrison is a 47 y.o. very pleasant male patient who presents with the following:  Had chest pain in the past and taken to ER and told had esophageal spasm.  Not taking medication for reflux but frequently symptomatic.  On Saturday developed pain in throat that has persisted.  Now has some dyspepsia.  Nonsmoker but uses dipped tobacco.  Limited caffeine use and tries to eat a low fat diet. No cough, wheezing, shortness of breath.  No coryza.  No improvement with over the counter medications or other home remedies. Denies other complaint or health concern today.   Patient Active Problem List   Diagnosis Date Noted  . Bilateral inguinal hernia 07/02/2012  . Umbilical hernia 07/02/2012    Past Medical History  Diagnosis Date  . Allergy   . Anxiety     Past Surgical History  Procedure Laterality Date  . Wisdom tooth extraction    . Hernia repair      History  Substance Use Topics  . Smoking status: Never Smoker   . Smokeless tobacco: Current User    Types: Snuff  . Alcohol Use: 7.2 oz/week    12 Cans of beer per week    Family History  Problem Relation Age of Onset  . Arthritis Mother   . Arthritis Father   . Hyperlipidemia Father   . Arthritis Brother     Allergies  Allergen Reactions  . Tuberculin Tests Other (See Comments)    Had adverse reaction in college    Medication list has been reviewed and updated.  Current Outpatient Prescriptions on File Prior to Visit  Medication Sig Dispense Refill  . alum & mag hydroxide-simeth (MAALOX/MYLANTA) 200-200-20 MG/5ML suspension Take 30 mLs by mouth once.  355 mL  0   No current facility-administered medications on file prior to visit.    Review of  Systems:  As per HPI, otherwise negative.    Physical Examination: Filed Vitals:   01/04/13 1017  BP: 144/92  Pulse: 71  Temp: 98.6 F (37 C)  Resp: 18   Filed Vitals:   01/04/13 1017  Weight: 210 lb (95.255 kg)   Body mass index is 28.47 kg/(m^2). Ideal Body Weight:    GEN: WDWN, NAD, Non-toxic, A & O x 3 HEENT: Atraumatic, Normocephalic. Neck supple. No masses, No LAD. Ears and Nose: No external deformity. CV: RRR, No M/G/R. No JVD. No thrill. No extra heart sounds. PULM: CTA B, no wheezes, crackles, rhonchi. No retractions. No resp. distress. No accessory muscle use. ABD: S, NT, ND, +BS. No rebound. No HSM. EXTR: No c/c/e NEURO Normal gait.  PSYCH: Normally interactive. Conversant. Not depressed or anxious appearing.  Calm demeanor.    Assessment and Plan: gerd nexium Labs  Signed,  Phillips Odor, MD

## 2013-01-07 ENCOUNTER — Other Ambulatory Visit: Payer: Self-pay | Admitting: Radiology

## 2013-01-07 MED ORDER — DEXLANSOPRAZOLE 30 MG PO CPDR: 30.0000 mg | DELAYED_RELEASE_CAPSULE | Freq: Every day | ORAL | Status: DC

## 2013-01-07 NOTE — Telephone Encounter (Signed)
Sent dexilant

## 2013-01-07 NOTE — Telephone Encounter (Signed)
Please advise Nexium is not preferred drug on this patients plan, do you want to change? Dexilant 30 mg is covered under his plan, I have pended. Advise if okay

## 2013-01-09 MED ORDER — AMOXICILL-CLARITHRO-LANSOPRAZ PO MISC: Freq: Two times a day (BID) | ORAL | Status: DC

## 2013-01-09 NOTE — Addendum Note (Signed)
Addended by: Carmelina Dane on: 01/09/2013 08:51 AM   Modules accepted: Orders

## 2013-01-10 ENCOUNTER — Telehealth: Payer: Self-pay

## 2013-01-10 MED ORDER — CLARITHROMYCIN 500 MG PO TABS: 500.0000 mg | ORAL_TABLET | Freq: Two times a day (BID) | ORAL | Status: DC

## 2013-01-10 MED ORDER — LANSOPRAZOLE 30 MG PO CPDR: 30.0000 mg | DELAYED_RELEASE_CAPSULE | Freq: Every day | ORAL | Status: DC

## 2013-01-10 MED ORDER — AMOXICILLIN 500 MG PO CAPS: 500.0000 mg | ORAL_CAPSULE | Freq: Three times a day (TID) | ORAL | Status: DC

## 2013-01-10 NOTE — Telephone Encounter (Signed)
Sent to pharmacy 

## 2013-01-10 NOTE — Telephone Encounter (Signed)
E 

## 2013-01-10 NOTE — Telephone Encounter (Signed)
Prev pack not covered can we split ? pended, review for accuracy

## 2013-01-10 NOTE — Telephone Encounter (Signed)
PT WOULD LIKE A CALL BACK REGARDING HIS MEDICINE. HIS INSURANCE WOULDN'T COVER ONE OF THEM, BUT HE WAS ABLE TO GET OVER THE COUNTER PLEASE CALL [phone]

## 2013-01-11 ENCOUNTER — Ambulatory Visit: Payer: 59

## 2013-01-11 NOTE — Telephone Encounter (Signed)
Thanks I advised him

## 2013-01-12 NOTE — Telephone Encounter (Signed)
Looking at the chart, it appears that the patient already has a prescription for Dexilant.  If that is the case, he can take that BID x 14 days along with the antibiotics for H. Pylori. If not, change to omeprazole 20 mg BID.

## 2013-01-12 NOTE — Telephone Encounter (Signed)
Received a prior auth req for lansoprazole. [employer] stated it is a plan exclusion and the covered alternatives are omeprazole and pantoprazole. Can we change it to one of these to be used w/the Abx for H Pylori?

## 2013-01-12 NOTE — Telephone Encounter (Signed)
Yes

## 2013-01-13 NOTE — Telephone Encounter (Signed)
Left message for him to call me back so I can advise.

## 2013-01-17 ENCOUNTER — Telehealth: Payer: Self-pay | Admitting: Radiology

## 2013-01-17 NOTE — Telephone Encounter (Signed)
He is taking 2 nexium a day. And using the antibiotics.

## 2013-01-17 NOTE — Telephone Encounter (Signed)
Spoke to patient

## 2013-01-17 NOTE — Telephone Encounter (Signed)
He is advised, and is improving.

## 2013-01-17 NOTE — Telephone Encounter (Signed)
Patient took 2 antibiotics this am. Ate breakfast. Thinks he may have taken 2 too many today. He did not have any GI upset. Advised him to make sure he drinks enough water and resume his usual schedule tomorrow.

## 2013-01-22 ENCOUNTER — Ambulatory Visit (INDEPENDENT_AMBULATORY_CARE_PROVIDER_SITE_OTHER): Payer: 59 | Admitting: Family Medicine

## 2013-01-22 VITALS — BP 106/74 | HR 69 | Temp 97.9°F | Resp 18 | Ht 72.5 in | Wt 204.0 lb

## 2013-01-22 DIAGNOSIS — K219 Gastro-esophageal reflux disease without esophagitis: Secondary | ICD-10-CM

## 2013-01-22 DIAGNOSIS — D1739 Benign lipomatous neoplasm of skin and subcutaneous tissue of other sites: Secondary | ICD-10-CM

## 2013-01-22 DIAGNOSIS — D171 Benign lipomatous neoplasm of skin and subcutaneous tissue of trunk: Secondary | ICD-10-CM

## 2013-01-22 DIAGNOSIS — F17200 Nicotine dependence, unspecified, uncomplicated: Secondary | ICD-10-CM

## 2013-01-22 NOTE — Progress Notes (Signed)
Subjective: Patient has had some episodes of dyspepsia and epigastric discomfort. He has 2 small nodules, one under the left breast in the left epigastrium. He worries about cancer. A friend of his was telling him the details of his son who has extensive gastric cancer, and that scares him.  He also continues to have a little use snuff or chewing tobacco. He realizes that it was not good for him but he is addicted. Wear long talk about that.  Objective: No acute distress, anxious as he talks. Chest clear. Heart regular without murmurs. Has a small lipoma underneath his left breast and one on his left epigastrium palpable. Abdomen soft without masses tenderness. Reviewed his H. pylori test within which is only weakly positive. He's just now finishing the medication.  Assessment: GERD Weakly positive H. pylori Epigastric pain Anxiety Lipomas  Plan: Reassurance Refer for EGD He can take some ranitidine in addition to his Nexium if he is having more pain.

## 2013-01-22 NOTE — Patient Instructions (Addendum)
Will contact you with the gastroenterology appointment. Should you not hear from our office in the next 5 or 6 days call and speak to the referrals desk.  Continue acid suppressing medication  Think seriously about our discussion regarding the use of tobacco products.  Return if needed

## 2013-02-01 ENCOUNTER — Telehealth: Payer: Self-pay

## 2013-02-01 NOTE — Telephone Encounter (Signed)
Patient would like to know if he can discontinue using his Nexium medication. He also wants information regarding his Gastro referral. Please call him back at [phone]. Thanks

## 2013-02-02 NOTE — Telephone Encounter (Signed)
   Pt has an appt with dr Loreta Ave on 02/08/13 at 1030      Patient advised of appt. And advised he can call to RS since this is not good for him. He is also advised he can d/c the Nexium.

## 2013-02-15 ENCOUNTER — Other Ambulatory Visit (HOSPITAL_COMMUNITY): Payer: Self-pay | Admitting: Cardiology

## 2013-02-15 DIAGNOSIS — R079 Chest pain, unspecified: Secondary | ICD-10-CM

## 2013-02-17 ENCOUNTER — Encounter (HOSPITAL_COMMUNITY): Payer: 59

## 2013-03-23 ENCOUNTER — Ambulatory Visit: Payer: 59

## 2013-03-29 ENCOUNTER — Ambulatory Visit (INDEPENDENT_AMBULATORY_CARE_PROVIDER_SITE_OTHER): Payer: 59 | Admitting: Family Medicine

## 2013-03-29 ENCOUNTER — Encounter: Payer: Self-pay | Admitting: Family Medicine

## 2013-03-29 VITALS — BP 120/72 | HR 72 | Temp 98.7°F | Resp 16 | Ht 71.0 in | Wt 198.6 lb

## 2013-03-29 DIAGNOSIS — R079 Chest pain, unspecified: Secondary | ICD-10-CM

## 2013-03-29 DIAGNOSIS — K219 Gastro-esophageal reflux disease without esophagitis: Secondary | ICD-10-CM

## 2013-03-29 NOTE — Progress Notes (Signed)
  Subjective:    Patient ID: Nathan Harrison, male    DOB: Aug 13, 1965, 47 y.o.   MRN: [id number]  HPI 47 yo male with complaint of chronic epigastric pain.  He is currently being followed by GI who recommends EGD and colonoscopy.  He needs stress test prior to EGD and would like referral to cardiologist.  He has had two mild episodes of CP in past, once evaluated in ED, but nothing recently.  Saw a cardiologist in past, but would like to see someone new.  Currently taking Nexium for his abdominal pain.  Past Medical History  Diagnosis Date  . Allergy   . Anxiety    Past Surgical History  Procedure Laterality Date  . Wisdom tooth extraction    . Hernia repair     Allergies  Allergen Reactions  . Tuberculin Tests Other (See Comments)    Had adverse reaction in college   He has stopped taking daily aspirin at the request of his GI doctor.   Review of Systems  Constitutional: Negative for fever, chills and fatigue.  Respiratory: Negative for chest tightness.   Cardiovascular: Negative for chest pain and palpitations.  Gastrointestinal: Positive for abdominal pain. Negative for nausea, vomiting, diarrhea, constipation, blood in stool, abdominal distention and rectal pain.  Neurological: Negative for weakness, numbness and headaches.       Objective:   Physical Exam Blood pressure 120/72, pulse 72, temperature 98.7 F (37.1 C), temperature source Oral, resp. rate 16, height 5\' 11"  (1.803 m), weight 198 lb 9.6 oz (90.084 kg), SpO2 100.00%. Body mass index is 27.71 kg/(m^2). Well-developed, well nourished male who is awake, alert and oriented, in NAD. HEENT: [place]/AT, PERRL, EOMI. OP is clear. Neck: supple, non-tender, no lymphadenopathy, thyromegaly. Heart: RRR, no murmur Lungs: normal effort, CTA Abdomen: normo-active bowel sounds, supple, non-tender, small palpable mass 1cm left of umbilicus.  No rebound no guarding. Extremities: no cyanosis, clubbing or edema. Skin: warm and dry  without rash. Psychologic: good mood and appropriate affect, normal speech and behavior.   Assessment & Plan:  1.  Gastritis vs. Peptic Ulcer Disease 2.  Chest pain Had lengthy discussion about stress test and need for EGD with patient.  Will give him a referral to a new cardiologist and he will arrange for stress then get his EGD done.  In addition he will take either pepcid or zantac as needed in addition to the Nexium he currently takes.  Spent greater than 25 minutes of face to face time discussing history, need for specialty testing and arranging for referral.

## 2013-05-02 NOTE — Progress Notes (Signed)
History and physical exam reviewed with Dr. McGrath. Agree with A/P. 

## 2013-05-19 ENCOUNTER — Other Ambulatory Visit (HOSPITAL_COMMUNITY): Payer: Self-pay | Admitting: Internal Medicine

## 2013-05-19 DIAGNOSIS — R0789 Other chest pain: Secondary | ICD-10-CM

## 2013-05-20 ENCOUNTER — Encounter: Payer: Self-pay | Admitting: Physician Assistant

## 2013-05-26 ENCOUNTER — Ambulatory Visit (HOSPITAL_COMMUNITY)
Admission: RE | Admit: 2013-05-26 | Discharge: 2013-05-26 | Disposition: A | Discharge status: Home / Self Care | Payer: 59 | Source: Ambulatory Visit | Attending: Physician Assistant | Admitting: Physician Assistant

## 2013-05-26 DIAGNOSIS — R0789 Other chest pain: Secondary | ICD-10-CM

## 2013-05-26 DIAGNOSIS — R079 Chest pain, unspecified: Secondary | ICD-10-CM | POA: Insufficient documentation

## 2013-06-22 ENCOUNTER — Encounter: Payer: Self-pay | Admitting: Physician Assistant

## 2013-07-27 ENCOUNTER — Telehealth: Payer: Self-pay | Admitting: *Deleted

## 2013-07-27 NOTE — Telephone Encounter (Signed)
[facility] called and they are resending the treadmill study results to you.  They said it was a normal study but they are sending it to your in basket and they apologize for not getting it to you.

## 2013-10-05 ENCOUNTER — Encounter: Payer: Self-pay | Admitting: Family Medicine

## 2014-04-02 ENCOUNTER — Emergency Department (HOSPITAL_COMMUNITY)
Admission: EM | Admit: 2014-04-02 | Discharge: 2014-04-02 | Disposition: A | Discharge status: Home / Self Care | Payer: No Typology Code available for payment source | Attending: Emergency Medicine | Admitting: Emergency Medicine

## 2014-04-02 ENCOUNTER — Encounter (HOSPITAL_COMMUNITY): Payer: Self-pay | Admitting: Emergency Medicine

## 2014-04-02 ENCOUNTER — Encounter: Payer: Self-pay | Admitting: Cardiology

## 2014-04-02 ENCOUNTER — Ambulatory Visit (INDEPENDENT_AMBULATORY_CARE_PROVIDER_SITE_OTHER): Payer: No Typology Code available for payment source | Admitting: Family Medicine

## 2014-04-02 ENCOUNTER — Emergency Department (HOSPITAL_COMMUNITY): Payer: No Typology Code available for payment source

## 2014-04-02 VITALS — BP 144/90 | HR 92 | Temp 98.0°F | Resp 16 | Ht 71.5 in | Wt 197.6 lb

## 2014-04-02 DIAGNOSIS — R0602 Shortness of breath: Secondary | ICD-10-CM | POA: Insufficient documentation

## 2014-04-02 DIAGNOSIS — I4891 Unspecified atrial fibrillation: Secondary | ICD-10-CM | POA: Diagnosis not present

## 2014-04-02 DIAGNOSIS — R002 Palpitations: Secondary | ICD-10-CM | POA: Diagnosis present

## 2014-04-02 DIAGNOSIS — Z79899 Other long term (current) drug therapy: Secondary | ICD-10-CM | POA: Insufficient documentation

## 2014-04-02 DIAGNOSIS — Z8659 Personal history of other mental and behavioral disorders: Secondary | ICD-10-CM | POA: Insufficient documentation

## 2014-04-02 DIAGNOSIS — I499 Cardiac arrhythmia, unspecified: Secondary | ICD-10-CM

## 2014-04-02 LAB — I-STAT CHEM 8, ED
BUN: 15 mg/dL (ref 6–23)
CALCIUM ION: 1.23 mmol/L (ref 1.12–1.23)
CHLORIDE: 107 meq/L (ref 96–112)
CREATININE: 1 mg/dL (ref 0.50–1.35)
GLUCOSE: 88 mg/dL (ref 70–99)
HCT: 51 % (ref 39.0–52.0)
Hemoglobin: 17.3 g/dL — ABNORMAL HIGH (ref 13.0–17.0)
POTASSIUM: 4.1 meq/L (ref 3.7–5.3)
Sodium: 143 mEq/L (ref 137–147)
TCO2: 30 mmol/L (ref 0–100)

## 2014-04-02 LAB — I-STAT TROPONIN, ED: Troponin i, poc: 0 ng/mL (ref 0.00–0.08)

## 2014-04-02 MED ORDER — DILTIAZEM HCL ER COATED BEADS 120 MG PO CP24: 120.0000 mg | ORAL_CAPSULE | Freq: Every day | ORAL | Status: DC

## 2014-04-02 MED ORDER — SODIUM CHLORIDE 0.9 % IV BOLUS (SEPSIS)
1000.0000 mL | Freq: Once | INTRAVENOUS | Status: AC
  Administered 2014-04-02: 1000 mL via INTRAVENOUS

## 2014-04-02 NOTE — ED Provider Notes (Addendum)
CSN: [id number]     Arrival date & time 04/02/14  1406 History   First MD Initiated Contact with Patient 04/02/14 1442     Chief Complaint  Patient presents with  . Atrial Fibrillation      HPI Received pt from [facility] for further eval of new onset Afib. Pt was sitting at home and began to feel palpitations in his chest. Pt denies +SOB, Dizziness or Chest pain. Vitals for EMS 140/72, 120-140's heart rate and 99% on room air. Pt AAOx4.  Past Medical History  Diagnosis Date  . Allergy   . Anxiety    Past Surgical History  Procedure Laterality Date  . Wisdom tooth extraction    . Hernia repair     Family History  Problem Relation Age of Onset  . Arthritis Mother   . Arthritis Father   . Hyperlipidemia Father   . Arthritis Brother    History  Substance Use Topics  . Smoking status: Never Smoker   . Smokeless tobacco: Current User    Types: Snuff  . Alcohol Use: Yes     Comment: rare    Review of Systems  All other systems reviewed and are negative  Allergies  Tuberculin tests  Home Medications   Prior to Admission medications   Medication Sig Start Date End Date Taking? Authorizing Provider  diltiazem (CARDIZEM CD) 120 MG 24 hr capsule Take 1 capsule (120 mg total) by mouth daily. 04/02/14   Dot Lanes, MD   BP 125/86  Pulse 74  Temp(Src) 98.5 F (36.9 C) (Oral)  Resp 20  Ht 6' (1.829 m)  Wt 197 lb 9 oz (89.614 kg)  BMI 26.79 kg/m2  SpO2 100% Physical Exam  Nursing note and vitals reviewed. Constitutional: He is oriented to person, place, and time. He appears well-developed and well-nourished. No distress.  HENT:  Head: Normocephalic and atraumatic.  Eyes: Pupils are equal, round, and reactive to light.  Neck: Normal range of motion.  Cardiovascular: Normal rate and intact distal pulses.  Exam reveals no gallop.   No murmur heard. Pulmonary/Chest: No respiratory distress.  Abdominal: Normal appearance. He exhibits no  distension.  Musculoskeletal: Normal range of motion.  Neurological: He is alert and oriented to person, place, and time. No cranial nerve deficit.  Skin: Skin is warm and dry. No rash noted.  Psychiatric: He has a normal mood and affect. His behavior is normal.    ED Course  Procedures (including critical care time)  Review of EKG done at urgent care confirms atrial fib with RVR. Patient now back in NSR and is asymtomatic.   Labs Review Labs Reviewed  I-STAT CHEM 8, ED - Abnormal; Notable for the following:    Hemoglobin 17.3 (*)    All other components within normal limits  I-STAT TROPOININ, ED    Imaging Review Dg Chest Portable 1 View  04/02/2014   CLINICAL DATA:  Atrial fibrillation  EXAM: PORTABLE CHEST - 1 VIEW  COMPARISON:  09/09/2012  FINDINGS: The heart size and mediastinal contours are within normal limits. Both lungs are clear. The visualized skeletal structures are unremarkable.  IMPRESSION: No active disease.   Electronically Signed   By: Kerby Moors M.D.   On: 04/02/2014 16:21     EKG Interpretation   Date/Time:  Sunday April 02 2014 14:45:16 EDT Ventricular Rate:  82 PR Interval:  154 QRS Duration: 73 QT Interval:  341 QTC Calculation: 398 R Axis:   66  Text Interpretation:  Sinus rhythm Probable anteroseptal infarct, old No  significant change since last tracing Confirmed by Kaveri Perras  MD, Attallah Ontko  [phone]) on 04/02/2014 3:57:27 PM     I spoke with cardiology who recommended to start patient on Cardizem 120 CD and they will arrange for followup in the a.m. Patient stable for discharge After treatment in the ED the patient feels back to baseline and wants to go home. MDM   Final diagnoses:  Atrial fibrillation, new onset        Dot Lanes, MD 04/03/14 1132  Dot Lanes, MD 04/03/14 1134

## 2014-04-02 NOTE — ED Notes (Signed)
Pt noted to be NSR on cardiac monitor.

## 2014-04-02 NOTE — Progress Notes (Signed)
Subjective:    Patient ID: Nathan Harrison, male    DOB: 1966/05/17, 48 y.o.   MRN: [id number]  HPI  Nathan Harrison is a 48 y.o. male  Started at around 10 am - after eating - used restroom, then noticed heartbeat irregular - pounding feeling in chest, irregular. Neck pulse irregular- fast then slow. Has noticed skipped beats in the past, but usually resolved quickly.  Felt some numbness in R ear this morning, slightly lightheaded this am, but not feeling numbness now. No focal weakness or numbness anywhere now, but some trouble with thinking - thinks this may be from anxiety of current symptoms.  No chest pain.   No caffeine in past 6 months, no regular alcohol - last drink 1/2 glass 2 weeks ago. Increased intensity with workouts recently, but no pain.  Dip tobacco, non smoker., no IDU.   Did notice late last night - felt tired, like getting a cold, but no CP or palpitations then. Felt ok overnight.    Patient Active Problem List   Diagnosis Date Noted  . Bilateral inguinal hernia 07/02/2012  . Umbilical hernia 48/18/5631   Past Medical History  Diagnosis Date  . Allergy   . Anxiety    Past Surgical History  Procedure Laterality Date  . Wisdom tooth extraction    . Hernia repair     Allergies  Allergen Reactions  . Tuberculin Tests Other (See Comments)    Had adverse reaction in college   Prior to Admission medications   Medication Sig Start Date End Date Taking? Authorizing Provider  Dexlansoprazole (DEXILANT) 30 MG capsule Take 1 capsule (30 mg total) by mouth daily. 01/07/13   Eleanore Kurtis Bushman, PA-C  esomeprazole (NEXIUM) 20 MG packet Take 20 mg by mouth daily before breakfast.    Historical Provider, MD  lansoprazole (PREVACID) 30 MG capsule Take 1 capsule (30 mg total) by mouth daily. 01/10/13   Theda Sers, PA-C   History   Social History  . Marital Status: Married    Spouse Name: N/A    Number of Children: N/A  . Years of Education: N/A   Occupational  History  . Not on file.   Social History Main Topics  . Smoking status: Never Smoker   . Smokeless tobacco: Current User    Types: Snuff  . Alcohol Use: 7.2 oz/week    12 Cans of beer per week  . Drug Use: No  . Sexual Activity: Yes    Birth Control/ Protection: Condom   Other Topics Concern  . Not on file   Social History Narrative  . No narrative on file       Review of Systems  Constitutional: Negative for fever and chills.  Respiratory: Negative for chest tightness and shortness of breath.   Cardiovascular: Positive for palpitations. Negative for chest pain and leg swelling.  Skin: Negative for color change.  Neurological: Positive for light-headedness and numbness (R ear initially, not now. ). Negative for dizziness, tremors, seizures, syncope, speech difficulty, weakness and headaches.       Objective:   Physical Exam  Vitals reviewed. Constitutional: He is oriented to person, place, and time. He appears well-developed and well-nourished. No distress.  HENT:  Head: Normocephalic and atraumatic.  Right Ear: External ear normal.  Left Ear: External ear normal.  Mouth/Throat: Oropharynx is clear and moist.  Eyes: Conjunctivae and EOM are normal. Pupils are equal, round, and reactive to light.  Neck: Normal range of motion. Neck supple.  No thyromegaly present.  Cardiovascular: Normal heart sounds and intact distal pulses.  An irregularly irregular rhythm present. Tachycardia present.   No murmur heard. irreg irreg.  Rate approx: 130-140.   Pulmonary/Chest: Effort normal and breath sounds normal. No respiratory distress. He has no wheezes.  Abdominal: Soft. He exhibits no distension. There is no tenderness. Hernia confirmed negative in the right inguinal area and confirmed negative in the left inguinal area.  Genitourinary: Prostate normal.  Musculoskeletal: Normal range of motion. He exhibits no edema and no tenderness.  Lymphadenopathy:    He has no cervical  adenopathy.  Neurological: He is alert and oriented to person, place, and time. He has normal strength and normal reflexes. No cranial nerve deficit or sensory deficit. Coordination normal. GCS eye subscore is 4. GCS verbal subscore is 5. GCS motor subscore is 6.  [place] prehospital scale - normal/negative.   Skin: Skin is warm and dry. He is not diaphoretic.  Psychiatric: He has a normal mood and affect. His behavior is normal.     Filed Vitals:   04/02/14 1307 04/02/14 1337  BP: 138/78 144/90  Pulse: 92   Temp: 98 F (36.7 C)   TempSrc: Oral   Resp: 16   Height: 5' 11.5" (1.816 m)   Weight: 197 lb 9.6 oz (89.631 kg)   SpO2: 98%     EKG: Afib- flutter  1:32 PM EMS called for transport, O2nc at 2L, heart monitor placed. IV placed R AC, NS.  1:40 PM Report given to EMS.      Assessment & Plan:   Nathan Harrison is a 48 y.o. male Irregular heart beat - Plan: EKG 12-Lead, sodium chloride 0.9 % bolus 1,000 mL Afib-flutter - new onset, slight initial dizziness, non focal exam, no chest pain. BP stable.   -IV placed, O2nC, monitor and transport to ER by EMS.   Meds ordered this encounter  Medications  . sodium chloride 0.9 % bolus 1,000 mL    Sig:    There are no Patient Instructions on file for this visit.

## 2014-04-02 NOTE — Discharge Instructions (Signed)

## 2014-04-02 NOTE — ED Notes (Signed)
Received pt from [facility] for further eval of new onset Afib. Pt was sitting at home and began to feel palpitations in his chest. Pt denies +SOB, Dizziness or Chest pain. Vitals for EMS 140/72, 120-140's heart rate and 99% on room air. Pt AAOx4.

## 2014-04-05 ENCOUNTER — Encounter: Payer: Self-pay | Admitting: *Deleted

## 2014-04-05 ENCOUNTER — Encounter (INDEPENDENT_AMBULATORY_CARE_PROVIDER_SITE_OTHER): Payer: No Typology Code available for payment source

## 2014-04-05 ENCOUNTER — Ambulatory Visit (INDEPENDENT_AMBULATORY_CARE_PROVIDER_SITE_OTHER): Payer: No Typology Code available for payment source | Admitting: Cardiology

## 2014-04-05 ENCOUNTER — Encounter: Payer: Self-pay | Admitting: Cardiology

## 2014-04-05 VITALS — BP 116/82 | HR 65 | Ht 72.0 in | Wt 195.0 lb

## 2014-04-05 DIAGNOSIS — F17200 Nicotine dependence, unspecified, uncomplicated: Secondary | ICD-10-CM

## 2014-04-05 DIAGNOSIS — I480 Paroxysmal atrial fibrillation: Secondary | ICD-10-CM

## 2014-04-05 DIAGNOSIS — R002 Palpitations: Secondary | ICD-10-CM | POA: Insufficient documentation

## 2014-04-05 DIAGNOSIS — F411 Generalized anxiety disorder: Secondary | ICD-10-CM

## 2014-04-05 DIAGNOSIS — I4891 Unspecified atrial fibrillation: Secondary | ICD-10-CM | POA: Insufficient documentation

## 2014-04-05 DIAGNOSIS — Z720 Tobacco use: Secondary | ICD-10-CM

## 2014-04-05 DIAGNOSIS — F419 Anxiety disorder, unspecified: Secondary | ICD-10-CM

## 2014-04-05 MED ORDER — DILTIAZEM HCL 60 MG PO TABS: ORAL_TABLET | ORAL | Status: DC

## 2014-04-05 MED ORDER — ASPIRIN EC 81 MG PO TBEC: 81.0000 mg | DELAYED_RELEASE_TABLET | Freq: Every day | ORAL | Status: DC

## 2014-04-05 MED ORDER — DILTIAZEM HCL ER COATED BEADS 120 MG PO CP24: 120.0000 mg | ORAL_CAPSULE | Freq: Every day | ORAL | Status: DC

## 2014-04-05 NOTE — Progress Notes (Signed)
Patient ID: Nathan Harrison, male   DOB: 1965-08-14, 48 y.o.   MRN: [id number] E-Cardio 48 hour holter monitor applied to patient.

## 2014-04-05 NOTE — Progress Notes (Signed)
Patient ID: Nathan Harrison, male   DOB: 02/06/1966, 48 y.o.   MRN: [id number]    Patient Name: Nathan Harrison Date of Encounter: 04/05/2014  Primary Care Provider:  Precious Reel, MD Primary Cardiologist:  Dorothy Spark  Problem List   Past Medical History  Diagnosis Date  . Allergy   . Anxiety    Past Surgical History  Procedure Laterality Date  . Wisdom tooth extraction    . Hernia repair     Allergies  Allergies  Allergen Reactions  . Tuberculin Tests Other (See Comments)    Had adverse reaction in college   HPI  A very pleasant 48 year old male with h/o GERD, hiatal and inguinal hearnia repair who was referred to [place] for an urgent visit after new diagnosis of atrial fibrillation with RVR. He went to the urgent care with HR 130 BPM and spontaneously cardioverted to SR few hours later. He states that he might have had more episodes around the time of surgery in January. He never searched medical care then. He [place] describes frequent palpitations consistent with PVCs. This Monday he felt sudden onset palpitations with some anxiety and mild associated SOB, no presyncope or syncope. He has no other medical history like HTN, lipids are followed by his PCP, he doesn't drink alcohol but chews tobacco excessively since age of 17. His mother has a-fib and unknown valvular disease, his grandmother has valvular disease and h/o stroke. He denies any other symptoms such as Chest pain, DOE, LE edema, orthopnea. He works as a [employer] manager, and works out.   Home Medications  Prior to Admission medications   Medication Sig Start Date End Date Taking? Authorizing Provider  diltiazem (CARDIZEM CD) 120 MG 24 hr capsule Take 1 capsule (120 mg total) by mouth daily. 04/02/14  Yes Dot Lanes, MD    Family History  Family History  Problem Relation Age of Onset  . Arthritis Mother   . Arthritis Father   . Hyperlipidemia Father   . Arthritis Brother     Social  History  History   Social History  . Marital Status: Married    Spouse Name: N/A    Number of Children: N/A  . Years of Education: N/A   Occupational History  . Not on file.   Social History Main Topics  . Smoking status: Never Smoker   . Smokeless tobacco: Current User    Types: Snuff  . Alcohol Use: Yes     Comment: rare  . Drug Use: No  . Sexual Activity: Yes    Birth Control/ Protection: Condom   Other Topics Concern  . Not on file   Social History Narrative  . No narrative on file     Review of Systems, as per HPI, otherwise negative General:  No chills, fever, night sweats or weight changes.  Cardiovascular:  No chest pain, dyspnea on exertion, edema, orthopnea, palpitations, paroxysmal nocturnal dyspnea. Dermatological: No rash, lesions/masses Respiratory: No cough, dyspnea Urologic: No hematuria, dysuria Abdominal:   No nausea, vomiting, diarrhea, bright red blood per rectum, melena, or hematemesis Neurologic:  No visual changes, wkns, changes in mental status. All other systems reviewed and are otherwise negative except as noted above.  Physical Exam  Blood pressure 116/82, pulse 65, height 6' (1.829 m), weight 195 lb (88.451 kg).  General: Pleasant, NAD Psych: Normal affect. Neuro: Alert and oriented X 3. Moves all extremities spontaneously. HEENT: Normal  Neck: Supple without bruits or JVD. Lungs:  Resp  regular and unlabored, CTA. Heart: RRR no s3, s4, or murmurs. Abdomen: Soft, non-tender, non-distended, BS + x 4.  Extremities: No clubbing, cyanosis or edema. DP/PT/Radials 2+ and equal bilaterally.  Labs:  No results found for this basename: CKTOTAL, CKMB, TROPONINI,  in the last 72 hours Lab Results  Component Value Date   WBC 8.8 09/08/2012   HGB 17.3* 04/02/2014   HCT 51.0 04/02/2014   MCV 91.2 09/08/2012   PLT 203 09/08/2012    No results found for this basename: DDIMER   No components found with this basename: POCBNP,     Component Value  Date/Time   NA 143 04/02/2014 1601   K 4.1 04/02/2014 1601   CL 107 04/02/2014 1601   CO2 26 09/08/2012 2359   GLUCOSE 88 04/02/2014 1601   BUN 15 04/02/2014 1601   CREATININE 1.00 04/02/2014 1601   CALCIUM 9.1 09/08/2012 2359   GFRNONAA 89* 09/08/2012 2359   GFRAA >90 09/08/2012 2359   No results found for this basename: CHOL, HDL, LDLCALC, TRIG    Accessory Clinical Findings  echocardiogram  ECG - SR, 65 BPM  Exercise Tolerance Stress test: 05/2013  Interpretation Summary: Resting ECG: Normal. Functional Capacity: Above Average (> 20%). HR Response to Exercise: Normal Overall HR Response To Exercise. BP Response to Exercise: Normal Resting BP with Appropriate Response. Chest Pain: No Chest Pain. Arrhythmias: Ventricular Premature Beats, Isolated. ST Changes: No Changes From Baseline.   Assessment & Plan  1. New dg of atrial fibrillation - order echo to evaluate anatomy - CHADS-VASc - 0 - only ASA 81 mg po daily - Continue Diltiazem 120 mg po daily - add Cardizem 60 mg po immediate release as needed  - 48 holter monitor to evaluate for other arrhythmias  2. BP - controlled  3. Lipids - followed by PCP  4. Chewing tobacco - counseling provided, he is motivated to quit  Follow up in 2 months  Dorothy Spark, MD, [facility] 04/05/2014, 9:51 AM

## 2014-04-05 NOTE — Patient Instructions (Addendum)
Your physician has recommended you make the following change in your medication:   [place] 120 MG LONG ACTING DAILY  DR [place] 60 MG -TAKE ONE TABLET AS NEEDED FOR ATRIAL FIBRILLATION   DR Meda Coffee WANTS YOU TO TAKE AN 61 MG ASPRIN DAILY   Your physician recommends that you return for lab work in: TODAY (CBC W DIFF, CMP, TSH)  Your physician has requested that you have an echocardiogram. Echocardiography is a painless test that uses sound waves to create images of your heart. It provides your doctor with information about the size and shape of your heart and how well your heart's chambers and valves are working. This procedure takes approximately one hour. There are no restrictions for this procedure.  Your physician has recommended that you wear a 48 HOUR holter monitor. Holter monitors are medical devices that record the heart's electrical activity. Doctors most often use these monitors to diagnose arrhythmias. Arrhythmias are problems with the speed or rhythm of the heartbeat. The monitor is a small, portable device. You can wear one while you do your normal daily activities. This is usually used to diagnose what is causing palpitations/syncope (passing out).    [employer] IS THE HEART MONITOR/ EKG YOU CAN PURCHASE FROM [employer] FOR YOUR SMART PHONE    Your physician recommends that you schedule a follow-up appointment in: [place]

## 2014-04-06 LAB — CBC WITH DIFFERENTIAL/PLATELET
Basophils Absolute: 0 10*3/uL (ref 0.0–0.1)
Basophils Relative: 0.4 % (ref 0.0–3.0)
Eosinophils Absolute: 0.1 10*3/uL (ref 0.0–0.7)
Eosinophils Relative: 0.7 % (ref 0.0–5.0)
HCT: 47 % (ref 39.0–52.0)
Hemoglobin: 16 g/dL (ref 13.0–17.0)
Lymphocytes Relative: 28.9 % (ref 12.0–46.0)
Lymphs Abs: 2.2 10*3/uL (ref 0.7–4.0)
MCHC: 34.1 g/dL (ref 30.0–36.0)
MCV: 92.4 fl (ref 78.0–100.0)
Monocytes Absolute: 0.4 10*3/uL (ref 0.1–1.0)
Monocytes Relative: 5.4 % (ref 3.0–12.0)
Neutro Abs: 4.9 10*3/uL (ref 1.4–7.7)
Neutrophils Relative %: 64.6 % (ref 43.0–77.0)
Platelets: 226 10*3/uL (ref 150.0–400.0)
RBC: 5.08 Mil/uL (ref 4.22–5.81)
RDW: 12.7 % (ref 11.5–15.5)
WBC: 7.6 10*3/uL (ref 4.0–10.5)

## 2014-04-06 LAB — TSH: TSH: 2.65 u[IU]/mL (ref 0.35–4.50)

## 2014-04-06 LAB — COMPREHENSIVE METABOLIC PANEL
ALT: 35 U/L (ref 0–53)
AST: 25 U/L (ref 0–37)
Albumin: 4.7 g/dL (ref 3.5–5.2)
Alkaline Phosphatase: 87 U/L (ref 39–117)
BUN: 21 mg/dL (ref 6–23)
CO2: 25 mEq/L (ref 19–32)
Calcium: 9.6 mg/dL (ref 8.4–10.5)
Chloride: 107 mEq/L (ref 96–112)
Creatinine, Ser: 1.3 mg/dL (ref 0.4–1.5)
GFR: 64.26 mL/min (ref >60.00)
Glucose, Bld: 105 mg/dL — ABNORMAL HIGH (ref 70–99)
Potassium: 4.6 mEq/L (ref 3.5–5.1)
Sodium: 142 mEq/L (ref 135–145)
Total Bilirubin: 0.7 mg/dL (ref 0.2–1.2)
Total Protein: 8.2 g/dL (ref 6.0–8.3)

## 2014-04-10 ENCOUNTER — Ambulatory Visit (HOSPITAL_COMMUNITY): Payer: No Typology Code available for payment source | Attending: Cardiology

## 2014-04-10 DIAGNOSIS — R002 Palpitations: Secondary | ICD-10-CM | POA: Diagnosis not present

## 2014-04-10 DIAGNOSIS — F411 Generalized anxiety disorder: Secondary | ICD-10-CM | POA: Insufficient documentation

## 2014-04-10 DIAGNOSIS — I4891 Unspecified atrial fibrillation: Secondary | ICD-10-CM

## 2014-04-10 NOTE — Progress Notes (Signed)
2D Echo completed. 04/10/2014

## 2014-06-07 ENCOUNTER — Encounter: Payer: Self-pay | Admitting: Cardiology

## 2014-06-07 ENCOUNTER — Ambulatory Visit (INDEPENDENT_AMBULATORY_CARE_PROVIDER_SITE_OTHER): Payer: No Typology Code available for payment source | Admitting: Cardiology

## 2014-06-07 VITALS — BP 118/78 | HR 75 | Ht 72.0 in | Wt 197.8 lb

## 2014-06-07 DIAGNOSIS — I480 Paroxysmal atrial fibrillation: Secondary | ICD-10-CM

## 2014-06-07 NOTE — Progress Notes (Signed)
Patient ID: Jahel Wavra, male   DOB: December 27, 1965, 48 y.o.   MRN: [id number]    Patient Name: Nathan Harrison Date of Encounter: 06/07/2014  Primary Care Provider:  Precious Reel, MD Primary Cardiologist:  Dorothy Spark  Problem List   Past Medical History  Diagnosis Date  . Allergy   . Anxiety    Past Surgical History  Procedure Laterality Date  . Wisdom tooth extraction    . Hernia repair     Allergies  Allergies  Allergen Reactions  . Tuberculin Tests Other (See Comments)    Had adverse reaction in college   HPI  A very pleasant 48 year old male with h/o GERD, hiatal and inguinal hearnia repair who was referred to [place] for an urgent visit after new diagnosis of atrial fibrillation with RVR. He went to the urgent care with HR 130 BPM and spontaneously cardioverted to SR few hours later. He states that he might have had more episodes around the time of surgery in January. He never searched medical care then. He describes frequent palpitations consistent with PVCs. This Monday he felt sudden onset palpitations with some anxiety and mild associated SOB, no presyncope or syncope. He has no other medical history like HTN, lipids are followed by his PCP, he doesn't drink alcohol but chews tobacco excessively since age of 66. His mother has a-fib and unknown valvular disease, his grandmother has valvular disease and h/o stroke. He denies any other symptoms such as Chest pain, DOE, LE edema, orthopnea. He works as a [employer] manager, and works out.   2 months follow up, no more palpitations, feels well, quit tobacco, etoh, chocolate.   Home Medications  Prior to Admission medications   Medication Sig Start Date End Date Taking? Authorizing Provider  diltiazem (CARDIZEM CD) 120 MG 24 hr capsule Take 1 capsule (120 mg total) by mouth daily. 04/02/14  Yes Dot Lanes, MD    Family History  Family History  Problem Relation Age of Onset  . Arthritis Mother   . Arthritis  Father   . Hyperlipidemia Father   . Arthritis Brother     Social History  History   Social History  . Marital Status: Married    Spouse Name: N/A    Number of Children: N/A  . Years of Education: N/A   Occupational History  . Not on file.   Social History Main Topics  . Smoking status: Never Smoker   . Smokeless tobacco: Current User    Types: Snuff  . Alcohol Use: Yes     Comment: rare  . Drug Use: No  . Sexual Activity: Yes    Birth Control/ Protection: Condom   Other Topics Concern  . Not on file   Social History Narrative     Review of Systems, as per HPI, otherwise negative General:  No chills, fever, night sweats or weight changes.  Cardiovascular:  No chest pain, dyspnea on exertion, edema, orthopnea, palpitations, paroxysmal nocturnal dyspnea. Dermatological: No rash, lesions/masses Respiratory: No cough, dyspnea Urologic: No hematuria, dysuria Abdominal:   No nausea, vomiting, diarrhea, bright red blood per rectum, melena, or hematemesis Neurologic:  No visual changes, wkns, changes in mental status. All other systems reviewed and are otherwise negative except as noted above.  Physical Exam  Blood pressure 118/78, pulse 75, height 6' (1.829 m), weight 197 lb 12.8 oz (89.721 kg).  General: Pleasant, NAD Psych: Normal affect. Neuro: Alert and oriented X 3. Moves all extremities spontaneously. HEENT: Normal  Neck: Supple without bruits or JVD. Lungs:  Resp regular and unlabored, CTA. Heart: RRR no s3, s4, or murmurs. Abdomen: Soft, non-tender, non-distended, BS + x 4.  Extremities: No clubbing, cyanosis or edema. DP/PT/Radials 2+ and equal bilaterally.  Labs:  No results for input(s): CKTOTAL, CKMB, TROPONINI in the last 72 hours. Lab Results  Component Value Date   WBC 7.6 04/05/2014   HGB 16.0 04/05/2014   HCT 47.0 04/05/2014   MCV 92.4 04/05/2014   PLT 226.0 04/05/2014    No results found for: DDIMER Invalid input(s): POCBNP      Component Value Date/Time   NA 142 04/05/2014 1105   K 4.6 04/05/2014 1105   CL 107 04/05/2014 1105   CO2 25 04/05/2014 1105   GLUCOSE 105* 04/05/2014 1105   BUN 21 04/05/2014 1105   CREATININE 1.3 04/05/2014 1105   CALCIUM 9.6 04/05/2014 1105   PROT 8.2 04/05/2014 1105   ALBUMIN 4.7 04/05/2014 1105   AST 25 04/05/2014 1105   ALT 35 04/05/2014 1105   ALKPHOS 87 04/05/2014 1105   BILITOT 0.7 04/05/2014 1105   GFRNONAA 89* 09/08/2012 2359   GFRAA >90 09/08/2012 2359   No results found for: CHOL  Accessory Clinical Findings  echocardiogram  ECG - SR, 65 BPM  Exercise Tolerance Stress test: 05/2013  Interpretation Summary: Resting ECG: Normal. Functional Capacity: Above Average (> 20%). HR Response to Exercise: Normal Overall HR Response To Exercise. BP Response to Exercise: Normal Resting BP with Appropriate Response. Chest Pain: No Chest Pain. Arrhythmias: Ventricular Premature Beats, Isolated. ST Changes: No Changes From Baseline.  ECHO: 06/08/2014 Left ventricle: The cavity size was normal. Wall thickness was normal. Systolic function was normal. The estimated ejection fraction was in the range of 55% to 60%. Wall motion was normal; there were no regional wall motion abnormalities. There was no evidence of elevated ventricular filling pressure by Doppler parameters.  ------------------------------------------------------------------- Aortic valve:  Trileaflet; normal thickness leaflets. Mobility was not restricted. Doppler: Transvalvular velocity was within the normal range. There was no stenosis. There was no regurgitation. ------------------------------------------------------------------- Aorta: Aortic root: The aortic root was normal in size. ------------------------------------------------------------------- Mitral valve:  Structurally normal valve.  Mobility was not restricted. Doppler: Transvalvular velocity was within the normal range. There  was no evidence for stenosis. There was no regurgitation.  Peak gradient (D): 3 mm Hg. ------------------------------------------------------------------- Left atrium: The atrium was at the upper limits of normal in size.    Assessment & Plan  1. New dg of atrial fibrillation - normal echo - CHADS-VASc - 0 - only ASA 81 mg po daily - Continue Diltiazem 120 mg po daily - add Cardizem 60 mg po immediate release as needed  - 48 holter monitor showed PVCs only  2. BP - controlled  3. Lipids - followed by PCP  4. Chewing tobacco - quit as well as etoh (was not excessive), chocolate  Follow up in 1 year   Dorothy Spark, MD, [facility] 06/07/2014, 8:19 AM

## 2014-06-07 NOTE — Patient Instructions (Signed)
Your physician recommends that you continue on your current medications as directed. Please refer to the Current Medication list given to you today.   Your physician wants you to follow-up in: ONE YEAR WITH DR NELSON You will receive a reminder letter in the mail two months in advance. If you don't receive a letter, please call our office to schedule the follow-up appointment.  

## 2014-07-19 ENCOUNTER — Telehealth: Payer: Self-pay | Admitting: Cardiology

## 2014-07-19 NOTE — Telephone Encounter (Signed)
Pt calling to inform Dr Meda Coffee that last night he experienced an episode of fast HR of 90 as he was trying to go to sleep.  Pt states he counted his radial pulse at that time for one full minute and rate noted was 90.  Pt states his pulse felt regular.  Pt denies any sob or cp at that time, and at present time.  Pt concerned this rate is too fast, and feels it causes him anxiety.  Informed the pt that his rate of 90 is considered WNL.  Asked the pt if he has anxiety, and pt stated "yes." Noted pt takes nothing for his anxiety.  Pt states that he does not drink caffeine anymore, he quit smoking, no longer drinks etoh, and exercises daily.  Pt states at that brief period of increase HR, he did not feel like this was afib.  Pt states his baseline rate is usually 78.  Informed pt that Dr Meda Coffee is out of the office this week, and being his current stability, I will forward this message to her for further review and recommendation, and follow-up thereafter.  Advised the pt to continue with his current med regimen and lifestyle changes, and to stay plenty hydrated.  Advised the pt that if he feels his anxiety is worsening without any med assistance, then he should contact his PCP to advise on this.  Pt verbalized understanding and agreeable to this plan.

## 2014-07-19 NOTE — Telephone Encounter (Signed)
New Msg       Pt calling states, his HR while resting was about 90 last night and today HR was 70.   Pt feels this is a little high and would like a call about this.

## 2014-07-21 NOTE — Telephone Encounter (Signed)
Yes, its normal, however, we can try to increase his Diltiazem CD to 180 mg po daily. He should follow with a PA in 3-4 weeks to make sure his HR and BP are not too low with this.

## 2014-07-24 NOTE — Telephone Encounter (Signed)
Spoke with this pt to ask current symptoms and give recommendations per Dr Meda Coffee.  Pt states he has had no further episodes since last telephone conversation on 07/19/14.  Informed the pt of Dr Francesca Oman recommendations for increasing the cardizem to 180 mg daily.  Pt states he would like to hold off on these orders for right now, for he states "I'm controlled." Pt states he will call back with any further episodes.  Dr Meda Coffee notified of this and agrees with this plan.

## 2014-10-04 ENCOUNTER — Telehealth: Payer: Self-pay | Admitting: Cardiology

## 2014-10-04 NOTE — Telephone Encounter (Signed)
New message      Pt was prescribed valtrex and abreva for a fever blister on his nose.  His 42yr old son has the flu----can he start tamaflu?

## 2014-10-04 NOTE — Telephone Encounter (Signed)
Pt calling to ask if its safe for him to take tamiflu and cardizem together.  Pt was considering being treated with prophylactic tamiflu due to his son being diagnosed with the flu today.  Pt has no cardiac symptoms and no flu symptoms at this time.  Asked the pharmacist here if there is any contraindication with tamiflu and cardizem and she states no.  Informed the pt that its safe to take both of this meds if needed.  Pt verbalized understanding and gracious for all the assistance provided.

## 2015-04-30 ENCOUNTER — Other Ambulatory Visit: Payer: Self-pay | Admitting: Cardiology

## 2015-06-27 ENCOUNTER — Encounter: Payer: Self-pay | Admitting: Cardiology

## 2015-06-27 ENCOUNTER — Ambulatory Visit (INDEPENDENT_AMBULATORY_CARE_PROVIDER_SITE_OTHER): Payer: 59 | Admitting: Cardiology

## 2015-06-27 VITALS — BP 130/68 | HR 72 | Ht 71.5 in | Wt 206.6 lb

## 2015-06-27 DIAGNOSIS — I480 Paroxysmal atrial fibrillation: Secondary | ICD-10-CM | POA: Diagnosis not present

## 2015-06-27 MED ORDER — DILTIAZEM HCL ER COATED BEADS 120 MG PO CP24: ORAL_CAPSULE | ORAL | Status: DC

## 2015-06-27 NOTE — Progress Notes (Signed)
Patient ID: Nathan Harrison, male   DOB: 1966-04-26, 49 y.o.   MRN: [id number]    Patient Name: Nathan Harrison Date of Encounter: 06/27/2015  Primary Care Provider:  Precious Reel, MD Primary Cardiologist:  Dorothy Spark  Problem List   Past Medical History  Diagnosis Date  . Allergy   . Anxiety    Past Surgical History  Procedure Laterality Date  . Wisdom tooth extraction    . Hernia repair     Allergies  Allergies  Allergen Reactions  . Tuberculin Tests Other (See Comments)    Had adverse reaction in college   HPI  A very pleasant 49 year old male with h/o GERD, hiatal and inguinal hearnia repair who was referred to [place] for an urgent visit after new diagnosis of atrial fibrillation with RVR. He went to the urgent care with HR 130 BPM and spontaneously cardioverted to SR few hours later. He states that he might have had more episodes around the time of surgery in January. He never searched medical care then. He describes frequent palpitations consistent with PVCs. This Monday he felt sudden onset palpitations with some anxiety and mild associated SOB, no presyncope or syncope. He has no other medical history like HTN, lipids are followed by his PCP, he doesn't drink alcohol but chews tobacco excessively since age of 58. His mother has a-fib and unknown valvular disease, his grandmother has valvular disease and h/o stroke. He denies any other symptoms such as Chest pain, DOE, LE edema, orthopnea. He works as a [employer] manager, and works out.   One year follow up, no more palpitations, feels well, quit tobacco, etoh, chocolate. He is active and denies any limitations, no chest pain, or DOE. No claudications.  He didn't need to use pill in the pocket.   Home Medications  Prior to Admission medications   Medication Sig Start Date End Date Taking? Authorizing Provider  diltiazem (CARDIZEM CD) 120 MG 24 hr capsule Take 1 capsule (120 mg total) by mouth daily. 04/02/14  Yes  Dot Lanes, MD    Family History  Family History  Problem Relation Age of Onset  . Arthritis Mother   . Arthritis Father   . Hyperlipidemia Father   . Arthritis Brother     Social History  Social History   Social History  . Marital Status: Married    Spouse Name: N/A  . Number of Children: N/A  . Years of Education: N/A   Occupational History  . Not on file.   Social History Main Topics  . Smoking status: Never Smoker   . Smokeless tobacco: Current User    Types: Snuff  . Alcohol Use: Yes     Comment: rare  . Drug Use: No  . Sexual Activity: Yes    Birth Control/ Protection: Condom   Other Topics Concern  . Not on file   Social History Narrative     Review of Systems, as per HPI, otherwise negative General:  No chills, fever, night sweats or weight changes.  Cardiovascular:  No chest pain, dyspnea on exertion, edema, orthopnea, palpitations, paroxysmal nocturnal dyspnea. Dermatological: No rash, lesions/masses Respiratory: No cough, dyspnea Urologic: No hematuria, dysuria Abdominal:   No nausea, vomiting, diarrhea, bright red blood per rectum, melena, or hematemesis Neurologic:  No visual changes, wkns, changes in mental status. All other systems reviewed and are otherwise negative except as noted above.  Physical Exam  Blood pressure 130/68, pulse 72, height 5' 11.5" (1.816 m), weight  206 lb 9.6 oz (93.713 kg).  General: Pleasant, NAD Psych: Normal affect. Neuro: Alert and oriented X 3. Moves all extremities spontaneously. HEENT: Normal  Neck: Supple without bruits or JVD. Lungs:  Resp regular and unlabored, CTA. Heart: RRR no s3, s4, or murmurs. Abdomen: Soft, non-tender, non-distended, BS + x 4.  Extremities: No clubbing, cyanosis or edema. DP/PT/Radials 2+ and equal bilaterally.  Labs:  No results for input(s): CKTOTAL, CKMB, TROPONINI in the last 72 hours. Lab Results  Component Value Date   WBC 7.6 04/05/2014   HGB 16.0 04/05/2014    HCT 47.0 04/05/2014   MCV 92.4 04/05/2014   PLT 226.0 04/05/2014    No results found for: DDIMER Invalid input(s): POCBNP    Component Value Date/Time   NA 142 04/05/2014 1105   K 4.6 04/05/2014 1105   CL 107 04/05/2014 1105   CO2 25 04/05/2014 1105   GLUCOSE 105* 04/05/2014 1105   BUN 21 04/05/2014 1105   CREATININE 1.3 04/05/2014 1105   CALCIUM 9.6 04/05/2014 1105   PROT 8.2 04/05/2014 1105   ALBUMIN 4.7 04/05/2014 1105   AST 25 04/05/2014 1105   ALT 35 04/05/2014 1105   ALKPHOS 87 04/05/2014 1105   BILITOT 0.7 04/05/2014 1105   GFRNONAA 89* 09/08/2012 2359   GFRAA >90 09/08/2012 2359   No results found for: CHOL  Accessory Clinical Findings  echocardiogram  ECG - SR, 65 BPM  Exercise Tolerance Stress test: 05/2013  Interpretation Summary: Resting ECG: Normal. Functional Capacity: Above Average (> 20%). HR Response to Exercise: Normal Overall HR Response To Exercise. BP Response to Exercise: Normal Resting BP with Appropriate Response. Chest Pain: No Chest Pain. Arrhythmias: Ventricular Premature Beats, Isolated. ST Changes: No Changes From Baseline.  ECHO: 06/08/2014 Left ventricle: The cavity size was normal. Wall thickness was normal. Systolic function was normal. The estimated ejection fraction was in the range of 55% to 60%. Wall motion was normal; there were no regional wall motion abnormalities. There was no evidence of elevated ventricular filling pressure by Doppler parameters.  ------------------------------------------------------------------- Aortic valve:  Trileaflet; normal thickness leaflets. Mobility was not restricted. Doppler: Transvalvular velocity was within the normal range. There was no stenosis. There was no regurgitation. ------------------------------------------------------------------- Aorta: Aortic root: The aortic root was normal in size. ------------------------------------------------------------------- Mitral valve:   Structurally normal valve.  Mobility was not restricted. Doppler: Transvalvular velocity was within the normal range. There was no evidence for stenosis. There was no regurgitation.  Peak gradient (D): 3 mm Hg. ------------------------------------------------------------------- Left atrium: The atrium was at the upper limits of normal in size.    Assessment & Plan  1. New dg of atrial fibrillation - normal echo - CHADS-VASc - 0 - only ASA 81 mg po daily - Continue Diltiazem 120 mg po daily -  Cardizem 60 mg po immediate release as needed,  - 48 holter monitor showed PVCs only - the patient had a long discussion with me about continuation of cardizem, he wants to think about it, but I think its safe for him to discontinue cardizem CD, if he has another episode of a-fib then restart (he only had one isolated episode of a-fib to date).  2. BP - controlled  3. Lipids - followed by PCP  4. Chewing tobacco - quit as well as etoh (was not excessive), chocolate  Follow up in 2 years.   Dorothy Spark, MD, [facility] 06/27/2015, 10:27 AM

## 2015-06-27 NOTE — Patient Instructions (Signed)
Your physician recommends that you continue on your current medications as directed. Please refer to the Current Medication list given to you today.    Your physician wants you to follow-up in: 2 YEARS WITH DR NELSON You will receive a reminder letter in the mail two months in advance. If you don't receive a letter, please call our office to schedule the follow-up appointment.  

## 2015-08-26 ENCOUNTER — Telehealth: Payer: Self-pay | Admitting: Internal Medicine

## 2015-08-26 NOTE — Telephone Encounter (Signed)
On Call Cardiology   Patient called. He felt like he may have had an episode of irregular heart beat. No CP or SOB. He felt urge to urinate and dry mouth. Lasted few minutes. Did not have to take extra Diltiazem. On Dil CD 120 mg po qd and ASA 81 mg po qd. He had one AF episode in 03/2014. Structurally normal heart. Holter for 48 hours in 03/2014 revealed SR with PACs and PVCs. Never been on antiarrhythmic drugs.    He now feels back to his baseline normal self.   We discussed the options of non-invasive (1-2 weeks Zio patch, 30 day even monitor) to invasive monitoring (insertable loop recorder) if he continues to have more episodes. We also briefly discussed the options of antiarrhtythmic drugs and ablation for progressive symptomatic paroxysmal AF.     He will contact Dr. Francesca Oman office on Monday.   Wandra Mannan, MD

## 2015-08-27 ENCOUNTER — Telehealth: Payer: Self-pay | Admitting: Cardiology

## 2015-08-27 NOTE — Telephone Encounter (Signed)
New message     Patient calling C/O Afib  over the weekend. Patient unsure if it was Afib.  Call the on-call MD on Saturday night was told extra medication was to be taken in heart rate is rapid.

## 2015-08-27 NOTE — Telephone Encounter (Signed)
Notified the pt that per Dr Meda Coffee, she reviewed our conversation from earlier this morning and she recommends that he continue his same regimen for now, if he has more episodes of afib, then he should let us know, for we may need to change his medications.  Pt verbalized understanding and agrees with this plan.

## 2015-08-27 NOTE — Telephone Encounter (Signed)
Continue the same regimen for now, if he has more episodes of a-fib he should let us know, we will change his medications.

## 2015-08-27 NOTE — Telephone Encounter (Signed)
Pt called the on-call MD, Dr. Susy Manor, for complaints of feeling as if he were back in afib.  Pt states that this only lasted for a very short time, then his symptoms subsided. Pt states that the only symptom he had when he felt like he was in afib, was feeling as if he was going to urinate himself, and had extremely dry mouth.  Pt had no cp, sob, palpitations, dizziness, pre-syncopal or syncopal episodes.  Per the on- call MD, he was instructed to call Dr Meda Coffee today and further review of his complaint over the weekend, and for further recommendation.  Pt states he is completely asymptomatic at this time.   Informed the pt that I will route this message to Dr Meda Coffee for further review and recommendation, and follow-up with him thereafter.  Pt verbalized understanding and agrees with this plan.  Below is the telephone encounter with the on-call cardiology Dr Susy Manor, with the pt on 08/26/15.    Wandra Mannan, MD at 08/26/2015 2:37 AM     Status: Signed       Expand All Collapse All   On Call Cardiology   Patient called. He felt like he may have had an episode of irregular heart beat. No CP or SOB. He felt urge to urinate and dry mouth. Lasted few minutes. Did not have to take extra Diltiazem. On Dil CD 120 mg po qd and ASA 81 mg po qd. He had one AF episode in 03/2014. Structurally normal heart. Holter for 48 hours in 03/2014 revealed SR with PACs and PVCs. Never been on antiarrhythmic drugs.   He now feels back to his baseline normal self.   We discussed the options of non-invasive (1-2 weeks Zio patch, 30 day even monitor) to invasive monitoring (insertable loop recorder) if he continues to have more episodes. We also briefly discussed the options of antiarrhtythmic drugs and ablation for progressive symptomatic paroxysmal AF.   He will contact Dr. Francesca Oman office on Monday.   Wandra Mannan, MD

## 2015-09-01 ENCOUNTER — Telehealth: Payer: Self-pay | Admitting: Internal Medicine

## 2015-09-02 NOTE — Telephone Encounter (Signed)
On Call Cardiology Note  I was contacted by the patient, who happened to check his heart rate earlier today and noticed that it was variable from 80-110 bpm.  He was asymptomatic and notes that he had been doing some work around the house prior to checking his pulse.  He was unsure if, given the occasional heart rate reading up to 110 bpm, he should take his as needed diltiazem.  I informed the patient that is typical to have some variability in his heart rate, particular with atrial fibrillation.  As long as his resting heart rate is predominantly less than 110 bpm and he is asymptomatic, there is no need to take his as needed diltiazem.  He should continue with his maintenance dose of 120 mg daily.  He voiced understanding of this.  L4 to copy this message to his cardiologist, Dr. Meda Coffee.  Nelva Bush, MD Cardiology Fellow

## 2015-09-04 ENCOUNTER — Telehealth: Payer: Self-pay | Admitting: Cardiology

## 2015-09-04 NOTE — Telephone Encounter (Signed)
New Message  Pt requested to speak w/ RN cocnerning his HR. Please call back and discuss.

## 2015-09-04 NOTE — Telephone Encounter (Signed)
Pt calling to report that his HR went up to 110 bpm, as he was doing weight lifting, and he is very concerned about this.  Pt states that his resting HR is always around 80 bpm and he is unsure what his HR should be while he's minimally exerting.  Pt states that he went and bought a pulse oximeter and is frequently checking his HR, just out of curiosity.  Pt states that he usually checks his HR at least 4-5 times a day, and gets different readings each time.  Pt states the highest HR he has recorded is 110 bpm.  Pt states with each reading he is completely asymptomatic, with no chest pain, SOB, DOE, palpitations, dizziness, pre-syncopal, or syncopal episodes.  Pt states he has not taken his PRN cardizem, for he states that he has not felt the need to do so.  Noted in the pts chart that he spoke with our Fellow Cardiologist on 2/11 Dr End, about the same complaint.  Reiterated to the pt Dr Darnelle Bos recommendations that as long as his resting heart rate is predominantly less than 110 bpm and he is asymptomatic, there is no need to take his as needed diltiazem, and he should continue with his maintenance dose Cardizem 120 mg daily.  Advised the pt that if he does develop symptoms like chest pain, sob, DOE, palpitations, HR 110 and above (with symptoms), dizziness, or feels like he's going to faint, then he should check his HR and consider taking his PRN cardizem, and notify our office immediately.  Informed the pt that he should only be checking his HR at most once a day, unless symptoms occur.  Informed the pt that continuously checking his HR several times a day, could make him more anxious and could cause his rate to increase a bit.  Informed the pt that Dr Meda Coffee is out of the office the rest of the day, but I will route this message to her for further review and recommendation if needed, and follow-up with the pt thereafter.  Pt verbalized understanding and agrees with this plan.

## 2016-07-13 ENCOUNTER — Other Ambulatory Visit: Payer: Self-pay | Admitting: Cardiology

## 2016-07-13 DIAGNOSIS — I480 Paroxysmal atrial fibrillation: Secondary | ICD-10-CM

## 2016-07-22 ENCOUNTER — Telehealth: Payer: Self-pay | Admitting: Cardiology

## 2016-07-22 DIAGNOSIS — L723 Sebaceous cyst: Secondary | ICD-10-CM | POA: Diagnosis not present

## 2016-07-22 NOTE — Telephone Encounter (Signed)
Mr. Bruney is calling because he was prescribed an antibiotic by his PCP and he would like to make sure that it does not interfere with his other medications. Please call. Thanks.

## 2016-07-22 NOTE — Telephone Encounter (Signed)
Notified the pt that I went and spoke to our Pharmacist Claiborne Billings, and taking Doxycycline with all of his prescribed cardiac medications, is not contraindicated.  Informed the pt that he can safely proceed with taking his Doxycycline.  Pt verbalized understanding and agrees with this plan.  Pt gracious for all the assistance provided.

## 2016-07-29 ENCOUNTER — Telehealth (HOSPITAL_COMMUNITY): Payer: Self-pay | Admitting: Cardiology

## 2016-07-29 NOTE — Telephone Encounter (Signed)
Pt called after episode of probable PAF he had chest pressure and and a little discomfort into neck he took his dilt. 60 mg and after brief time it resolved. He did not know if he should be seen, will defer to Dr. Meda Coffee.  He felt well when I talked with him but anxious.

## 2016-07-30 ENCOUNTER — Telehealth: Payer: Self-pay | Admitting: Cardiology

## 2016-07-30 NOTE — Telephone Encounter (Signed)
Dr. Meda Harrison, review this note Nathan Harrison sent you on this pt, from where he placed a call after hours last night. Pt is asymptomatic at this time and had only one noted episode that was relieved by his PRN diltiazem.  Please review and advise if needed.   Pt called back into the office to follow-up from call placed last night.  He wanted you to advise if needed.  Thanks!

## 2016-07-30 NOTE — Telephone Encounter (Signed)
Follow Up:     Whenever you call him back,please call him on wife's cell [phone]). Sometimes his phone work and sometimes it does not work.

## 2016-07-30 NOTE — Telephone Encounter (Signed)
Conversation  (Newest Message First)  Isaiah Serge, NP      Pt called after episode of probable PAF he had chest pressure and and a little discomfort into neck he took his dilt. 60 mg and after brief time it resolved. He did not know if he should be seen, will defer to Dr. Meda Coffee.  He felt well when I talked with him but anxious.

## 2016-07-30 NOTE — Telephone Encounter (Signed)
Spoke with the pt and informed him that Dr Meda Coffee would like for him to come in and see her in the clinic tomorrow 1/11 at her end slot at 1000.  Advised the pt to arrive 15 mins early.  Advised the pt to utilize his prn diltiazem, if he experiences any other episodes.  Pt verbalized understanding and agrees with this plan.  Pt gracious for all the assistance provided.   Will make chart prep aware of add on.

## 2016-07-30 NOTE — Telephone Encounter (Signed)
Bring him tomorrow 07/31/2016 at 10 am.

## 2016-07-30 NOTE — Telephone Encounter (Signed)
-----   Message from Isaiah Serge, NP sent at 07/29/2016  6:24 PM EST ----- Please direct pt if needs follow up one episode of probable a fib.

## 2016-07-31 ENCOUNTER — Encounter: Payer: Self-pay | Admitting: Cardiology

## 2016-07-31 ENCOUNTER — Ambulatory Visit (INDEPENDENT_AMBULATORY_CARE_PROVIDER_SITE_OTHER): Payer: 59 | Admitting: Cardiology

## 2016-07-31 ENCOUNTER — Encounter (INDEPENDENT_AMBULATORY_CARE_PROVIDER_SITE_OTHER): Payer: Self-pay

## 2016-07-31 VITALS — BP 120/80 | HR 65 | Ht 71.5 in | Wt 205.4 lb

## 2016-07-31 DIAGNOSIS — R5383 Other fatigue: Secondary | ICD-10-CM

## 2016-07-31 DIAGNOSIS — I480 Paroxysmal atrial fibrillation: Secondary | ICD-10-CM

## 2016-07-31 DIAGNOSIS — I10 Essential (primary) hypertension: Secondary | ICD-10-CM | POA: Diagnosis not present

## 2016-07-31 NOTE — Patient Instructions (Signed)
Medication Instructions:   Your physician recommends that you continue on your current medications as directed. Please refer to the Current Medication list given to you today.    Labwork:  TODAY--B12, CMET, MAGNESIUM, CBC W DIFF, TSH, AND LIPIDS     Follow-Up:  Your physician wants you to follow-up in: [place] will receive a reminder letter in the mail two months in advance. If you don't receive a letter, please call our office to schedule the follow-up appointment.        If you need a refill on your cardiac medications before your next appointment, please call your pharmacy.

## 2016-07-31 NOTE — Progress Notes (Signed)
Patient ID: Nathan Harrison, male   DOB: 1966-07-16, 51 y.o.   MRN: [id number]    Patient Name: Nathan Harrison Date of Encounter: 07/31/2016  Primary Care Provider:  Precious Reel, MD Primary Cardiologist:  Ena Dawley  Problem List   Past Medical History:  Diagnosis Date  . Allergy   . Anxiety    Past Surgical History:  Procedure Laterality Date  . HERNIA REPAIR    . WISDOM TOOTH EXTRACTION     Allergies  Allergies  Allergen Reactions  . Tuberculin Tests Other (See Comments)    Had adverse reaction in college   HPI  A very pleasant 51 year old male with h/o GERD, hiatal and inguinal hearnia repair who was referred to [place] for an urgent visit after new diagnosis of atrial fibrillation with RVR. He went to the urgent care with HR 130 BPM and spontaneously cardioverted to SR few hours later. He states that he might have had more episodes around the time of surgery in January. He never searched medical care then. He describes frequent palpitations consistent with PVCs. This Monday he felt sudden onset palpitations with some anxiety and mild associated SOB, no presyncope or syncope. He has no other medical history like HTN, lipids are followed by his PCP, he doesn't drink alcohol but chews tobacco excessively since age of 37. His mother has a-fib and unknown valvular disease, his grandmother has valvular disease and h/o stroke. He denies any other symptoms such as Chest pain, DOE, LE edema, orthopnea. He works as a [employer] manager, and works out.   One year follow up, no more palpitations, feels well, quit tobacco, etoh, chocolate. He is active and denies any limitations, no chest pain, or DOE. No claudications.  He didn't need to use pill in the pocket.   07/31/2016 - the patient had another episode of atrial fibrillation while at work, he called yesterday sounding very anxious and stated that while at work he started feeling extra beats, and feeling uneasy, he went to his car,   when he started to feel fast palpitations without associated dizziness but mild chest tightness. He took a pill of Cardizem 60 mg pill that he carries with him an episode resolved within 5 minutes. He states that he was probably dehydrated and he was sleep deprived yesterday. Since the visit a year ago this is the first episode of atrial fibrillation, he otherwise denies exertional chest pain or shortness of breath. He performs heavy weightlifting and exercising at home on a regular basis and doesn't have any symptoms of chest pain or shortness of breath. He also denies any syncope. He is compliant with aspirin and diltiazem. Denies any lower extremity edema.  Home Medications  Prior to Admission medications   Medication Sig Start Date End Date Taking? Authorizing Provider  diltiazem (CARDIZEM CD) 120 MG 24 hr capsule Take 1 capsule (120 mg total) by mouth daily. 04/02/14  Yes Dot Lanes, MD    Family History  Family History  Problem Relation Age of Onset  . Arthritis Mother   . Arthritis Father   . Hyperlipidemia Father   . Arthritis Brother   . Heart attack Neg Hx     Social History  Social History   Social History  . Marital status: Married    Spouse name: N/A  . Number of children: N/A  . Years of education: N/A   Occupational History  . Not on file.   Social History Main Topics  . Smoking status:  Never Smoker  . Smokeless tobacco: Current User    Types: Snuff  . Alcohol use Yes     Comment: rare  . Drug use: No  . Sexual activity: Yes    Birth control/ protection: Condom   Other Topics Concern  . Not on file   Social History Narrative  . No narrative on file     Review of Systems, as per HPI, otherwise negative General:  No chills, fever, night sweats or weight changes.  Cardiovascular:  No chest pain, dyspnea on exertion, edema, orthopnea, palpitations, paroxysmal nocturnal dyspnea. Dermatological: No rash, lesions/masses Respiratory: No cough,  dyspnea Urologic: No hematuria, dysuria Abdominal:   No nausea, vomiting, diarrhea, bright red blood per rectum, melena, or hematemesis Neurologic:  No visual changes, wkns, changes in mental status. All other systems reviewed and are otherwise negative except as noted above.  Physical Exam  Blood pressure 120/80, pulse 65, height 5' 11.5" (1.816 m), weight 205 lb 6.4 oz (93.2 kg).  General: Pleasant, NAD Psych: Normal affect. Neuro: Alert and oriented X 3. Moves all extremities spontaneously. HEENT: Normal  Neck: Supple without bruits or JVD. Lungs:  Resp regular and unlabored, CTA. Heart: RRR no s3, s4, or murmurs. Abdomen: Soft, non-tender, non-distended, BS + x 4.  Extremities: No clubbing, cyanosis or edema. DP/PT/Radials 2+ and equal bilaterally.  Labs:  No results for input(s): CKTOTAL, CKMB, TROPONINI in the last 72 hours. Lab Results  Component Value Date   WBC 7.6 04/05/2014   HGB 16.0 04/05/2014   HCT 47.0 04/05/2014   MCV 92.4 04/05/2014   PLT 226.0 04/05/2014    No results found for: DDIMER Invalid input(s): POCBNP    Component Value Date/Time   NA 142 04/05/2014 1105   K 4.6 04/05/2014 1105   CL 107 04/05/2014 1105   CO2 25 04/05/2014 1105   GLUCOSE 105 (H) 04/05/2014 1105   BUN 21 04/05/2014 1105   CREATININE 1.3 04/05/2014 1105   CALCIUM 9.6 04/05/2014 1105   PROT 8.2 04/05/2014 1105   ALBUMIN 4.7 04/05/2014 1105   AST 25 04/05/2014 1105   ALT 35 04/05/2014 1105   ALKPHOS 87 04/05/2014 1105   BILITOT 0.7 04/05/2014 1105   GFRNONAA 89 (L) 09/08/2012 2359   GFRAA >90 09/08/2012 2359   No results found for: CHOL  Accessory Clinical Findings  ECG - SR, 68 BPM, unchanged from prior  Exercise Tolerance Stress test: 05/2013  Interpretation Summary: Resting ECG: Normal. Functional Capacity: Above Average (> 20%). HR Response to Exercise: Normal Overall HR Response To Exercise. BP Response to Exercise: Normal Resting BP with Appropriate  Response. Chest Pain: No Chest Pain. Arrhythmias: Ventricular Premature Beats, Isolated. ST Changes: No Changes From Baseline.  ECHO: 06/08/2014 Left ventricle: The cavity size was normal. Wall thickness was normal. Systolic function was normal. The estimated ejection fraction was in the range of 55% to 60%. Wall motion was normal; there were no regional wall motion abnormalities. There was no evidence of elevated ventricular filling pressure by Doppler parameters.  ------------------------------------------------------------------- Aortic valve:  Trileaflet; normal thickness leaflets. Mobility was not restricted. Doppler: Transvalvular velocity was within the normal range. There was no stenosis. There was no regurgitation. ------------------------------------------------------------------- Aorta: Aortic root: The aortic root was normal in size. ------------------------------------------------------------------- Mitral valve:  Structurally normal valve.  Mobility was not restricted. Doppler: Transvalvular velocity was within the normal range. There was no evidence for stenosis. There was no regurgitation.  Peak gradient (D): 3 mm Hg. ------------------------------------------------------------------- Left atrium: The  atrium was at the upper limits of normal in size.    Assessment & Plan  1. New dg of atrial fibrillation - normal echo with normal LV EF and S1 function and normal size of the left atrium. - CHADS-VASc - 1 - only ASA 81 mg po daily - Continue Diltiazem CD 120 mg po daily -  Cardizem 60 mg po immediate release as needed,  - 48 holter monitor showed PVCs only - We had a long discussion with the patient about potential use of different antiarrhythmics and anticoagulation at the end concluded we will continue Cardizem CD daily for now and short acting Cardizem as a pill in the pocket. If he has another episode we'll consider switching to different  antiarrhythmic or refer to an EP clinic for RFA consideration.   2. Hypertension - controlled on Diltiazem 120 mg po daily   3. Lipids - followed by PCP  4. Chewing tobacco - quit as well as etoh (was not excessive), chocolate  This was an urgent visit after 15 months for an acute episode of atrial fibrillation, the patient had many questions regarding risk and benefits of different atrial arrhythmic regimens, pros and cons of anticoagulation explained risk and benefits of potential stroke or bleeding, explained potential risk and benefits of different  antiarrhythmics and radiofrequency ablation. He had several questions regarding proper exercise use of [place] diet, total time spent with patient 45 minutes.   Patient hasn't had any labs checked in a while will check CBC, CMP, TSH, lipids and vitamin B12 level today.  Ena Dawley, MD, [facility] 07/31/2016, 11:22 AM

## 2016-08-01 ENCOUNTER — Telehealth: Payer: Self-pay | Admitting: *Deleted

## 2016-08-01 DIAGNOSIS — E785 Hyperlipidemia, unspecified: Secondary | ICD-10-CM | POA: Insufficient documentation

## 2016-08-01 LAB — COMPREHENSIVE METABOLIC PANEL
ALT: 21 IU/L (ref 0–44)
AST: 18 IU/L (ref 0–40)
Albumin/Globulin Ratio: 1.5 (ref 1.2–2.2)
Albumin: 4.5 g/dL (ref 3.5–5.5)
Alkaline Phosphatase: 85 IU/L (ref 39–117)
BUN/Creatinine Ratio: 21 — ABNORMAL HIGH (ref 9–20)
BUN: 21 mg/dL (ref 6–24)
Bilirubin Total: 0.4 mg/dL (ref 0.0–1.2)
CO2: 24 mmol/L (ref 18–29)
Calcium: 9.4 mg/dL (ref 8.7–10.2)
Chloride: 101 mmol/L (ref 96–106)
Creatinine, Ser: 1.02 mg/dL (ref 0.76–1.27)
GFR calc Af Amer: 99 mL/min/{1.73_m2} (ref >59)
GFR calc non Af Amer: 85 mL/min/{1.73_m2} (ref >59)
Globulin, Total: 3 g/dL (ref 1.5–4.5)
Glucose: 98 mg/dL (ref 65–99)
Potassium: 4.5 mmol/L (ref 3.5–5.2)
Sodium: 142 mmol/L (ref 134–144)
Total Protein: 7.5 g/dL (ref 6.0–8.5)

## 2016-08-01 LAB — LIPID PANEL
Chol/HDL Ratio: 4.5 ratio units (ref 0.0–5.0)
Cholesterol, Total: 193 mg/dL (ref 100–199)
HDL: 43 mg/dL (ref >39)
LDL Calculated: 132 mg/dL — ABNORMAL HIGH (ref 0–99)
Triglycerides: 92 mg/dL (ref 0–149)
VLDL Cholesterol Cal: 18 mg/dL (ref 5–40)

## 2016-08-01 LAB — CBC WITH DIFFERENTIAL/PLATELET
Basophils Absolute: 0 10*3/uL (ref 0.0–0.2)
Basos: 0 %
EOS (ABSOLUTE): 0 10*3/uL (ref 0.0–0.4)
Eos: 1 %
Hematocrit: 46.7 % (ref 37.5–51.0)
Hemoglobin: 15.6 g/dL (ref 13.0–17.7)
Immature Grans (Abs): 0 10*3/uL (ref 0.0–0.1)
Immature Granulocytes: 0 %
Lymphocytes Absolute: 2.1 10*3/uL (ref 0.7–3.1)
Lymphs: 34 %
MCH: 30.4 pg (ref 26.6–33.0)
MCHC: 33.4 g/dL (ref 31.5–35.7)
MCV: 91 fL (ref 79–97)
Monocytes Absolute: 0.4 10*3/uL (ref 0.1–0.9)
Monocytes: 6 %
Neutrophils Absolute: 3.7 10*3/uL (ref 1.4–7.0)
Neutrophils: 59 %
Platelets: 249 10*3/uL (ref 150–379)
RBC: 5.13 x10E6/uL (ref 4.14–5.80)
RDW: 13.1 % (ref 12.3–15.4)
WBC: 6.2 10*3/uL (ref 3.4–10.8)

## 2016-08-01 LAB — VITAMIN B12: Vitamin B-12: 414 pg/mL (ref 232–1245)

## 2016-08-01 LAB — MAGNESIUM: Magnesium: 2.4 mg/dL — ABNORMAL HIGH (ref 1.6–2.3)

## 2016-08-01 LAB — TSH: TSH: 2.89 u[IU]/mL (ref 0.450–4.500)

## 2016-08-01 NOTE — Telephone Encounter (Signed)
-----   Message from Dorothy Spark, MD sent at 08/01/2016  3:24 PM EST ----- Elevated lipids, I would recommend rosuvastatin 5 mg po daily and check lipids and CMP in 2 months, all other labs are normal.

## 2016-08-01 NOTE — Telephone Encounter (Signed)
ok 

## 2016-08-01 NOTE — Telephone Encounter (Signed)
Called the pt to inform him of his lab results per Dr Meda Coffee, showing elevated lipids, and recommendation to start him on rosuvastatin 5 mg po daily and recheck lipids and cmet in 2 months.   Per the pt, he states that he would like for his lipids to be checked again next Monday 1/15, for he states he was not fasting at his OV appt with Dr Meda Coffee when his lipids were checked.   Advised the pt that we can repeat his lipids and he should come fasting to that lab appt on 1/15.  Provided fasting instructions to the pt.   Pt verbalized understanding and agrees with this plan.  Will forward this message to Dr Meda Coffee as an Juluis Rainier, on pt wanting his lipids repeated on Monday 1/15.

## 2016-08-04 ENCOUNTER — Telehealth: Payer: Self-pay | Admitting: Cardiology

## 2016-08-04 ENCOUNTER — Other Ambulatory Visit (INDEPENDENT_AMBULATORY_CARE_PROVIDER_SITE_OTHER): Payer: 59

## 2016-08-04 DIAGNOSIS — E785 Hyperlipidemia, unspecified: Secondary | ICD-10-CM | POA: Diagnosis not present

## 2016-08-04 NOTE — Telephone Encounter (Signed)
Walk In pt Form-Pt has Several Questions gave to [facility].

## 2016-08-05 ENCOUNTER — Telehealth: Payer: Self-pay | Admitting: Cardiology

## 2016-08-05 DIAGNOSIS — I480 Paroxysmal atrial fibrillation: Secondary | ICD-10-CM

## 2016-08-05 DIAGNOSIS — E7849 Other hyperlipidemia: Secondary | ICD-10-CM

## 2016-08-05 LAB — LIPID PANEL
Chol/HDL Ratio: 4.6 ratio units (ref 0.0–5.0)
Cholesterol, Total: 194 mg/dL (ref 100–199)
HDL: 42 mg/dL (ref >39)
LDL Calculated: 134 mg/dL — ABNORMAL HIGH (ref 0–99)
Triglycerides: 88 mg/dL (ref 0–149)
VLDL Cholesterol Cal: 18 mg/dL (ref 5–40)

## 2016-08-05 MED ORDER — ROSUVASTATIN CALCIUM 5 MG PO TABS: 5.0000 mg | ORAL_TABLET | Freq: Every day | ORAL | 3 refills | Status: DC

## 2016-08-05 NOTE — Telephone Encounter (Signed)
Pt calling to inform Dr Meda Coffee that he had an episode of "afib" while he was walking up the stairs carrying lumber in his hands.   Pt states he did not utilize his pill in a pocket, for he hates taking medications.   Pt states that ever since he found out his LDL is elevated and confirmed x 2 lipid panels drawn, he has been very anxious and notes he feels as if he is going in and out of afib.  Pt states this "scares me."  While the pt was on the phone, I advised him to take his PRN diltiazem and sit down and relax.  Pt was very worked up over the phone and worried about "reoccurring afib episodes and my high cholesterol."  Pt asked if Dr Meda Coffee would order for him to have a 30 day event monitor to see if he is truly  going in and out of afib, or having frequent PVC's.   After speaking with the pt for 15 mins and calming him down, he noted that he was feeling much better after following advice and taking his PRN diltiazem.  Pt states "I am feeling much better."  Pt did mention again that he would still like to have a monitor placed, for this "feeling I am experiencing worries me."  Informed the pt that I will route his concerns to Dr Meda Coffee to review and advise on, and follow-up with the pt once recommendations received.  Pt verbalized understanding and agrees with this plan.

## 2016-08-05 NOTE — Telephone Encounter (Signed)
Follow Up:    Pt had another episode of atrial fib,wants to talk to somebody now please.

## 2016-08-05 NOTE — Telephone Encounter (Signed)
Please start him on a 30 day monitor.

## 2016-08-05 NOTE — Telephone Encounter (Signed)
Follow Up:; ° ° °Returning your call. °

## 2016-08-07 ENCOUNTER — Telehealth: Payer: Self-pay | Admitting: Interventional Cardiology

## 2016-08-07 DIAGNOSIS — I480 Paroxysmal atrial fibrillation: Secondary | ICD-10-CM

## 2016-08-07 NOTE — Telephone Encounter (Signed)
    Patient called due to feeling anxious.  He has had a few episodes of PVCs over the past 36 hours.  He does not think this was his AFib.  He took a prn Diltiazem 60 mg last evening.  He felt fine.  He then took his long acting Cartia at 10 PM like he normally does.  He had some tightness in his neck in jaw that was quite mild.  He went to sleep and had no problems.  He woke up and felt well.    He was using a Field seismologist and he felt some more tightness in his neck.  He felt that he had to stretch his neck to make it feel better.  Sx were very mild.  He has not had any exertional sx.  Overall, he is more anxious about a carotid blockage.  He has no neurologic sx.  No slurring of speech.  No facial droop or weakness.  Currently, he feels better.    He shoveled his driveway without sx yesterday.  He walked a few days ago without problems.  He continues to take aspirin daily.   He feels reassured.  He will go to the ER if he has any more problems.  For now, he will stay home.   Jettie Booze, MD

## 2016-08-08 NOTE — Telephone Encounter (Signed)
Spoke with pt and made him aware of Dr. Francesca Oman recommendations.  Pt agreeable to event monitor.  Advised pt a scheduler will call to make an appt to have monitor placed.  Pt appreciative for assistance.

## 2016-08-11 ENCOUNTER — Ambulatory Visit (INDEPENDENT_AMBULATORY_CARE_PROVIDER_SITE_OTHER): Payer: 59

## 2016-08-11 DIAGNOSIS — I480 Paroxysmal atrial fibrillation: Secondary | ICD-10-CM | POA: Diagnosis not present

## 2016-08-13 ENCOUNTER — Telehealth: Payer: Self-pay | Admitting: Physician Assistant

## 2016-08-13 NOTE — Telephone Encounter (Signed)
Please order a coronary CTA to rule oyr CAD and a carotid US. Thank you, KN

## 2016-08-13 NOTE — Telephone Encounter (Signed)
Patient contacted the after hour answering service, Dr. Francesca Harrison patient. H/o PAF and PVC, he has been having occasional skipped beats, I informed him it is likely PVC. Also he has some numbness after gym, but no pain on arm rotation or stretching, I have advised him to monitor for now.   Nathan Corrigan PA Pager: [phone]

## 2016-08-13 NOTE — Telephone Encounter (Signed)
Cardiac CTA and carotis [place] order placed in epic. Scheduler aware of order

## 2016-08-14 ENCOUNTER — Ambulatory Visit (HOSPITAL_COMMUNITY)
Admission: RE | Admit: 2016-08-14 | Discharge: 2016-08-14 | Disposition: A | Discharge status: Home / Self Care | Payer: 59 | Source: Ambulatory Visit | Attending: Cardiovascular Disease | Admitting: Cardiovascular Disease

## 2016-08-14 DIAGNOSIS — I480 Paroxysmal atrial fibrillation: Secondary | ICD-10-CM | POA: Diagnosis not present

## 2016-08-14 NOTE — Telephone Encounter (Signed)
Carotid scheduled for today at 2:30 pm.

## 2016-08-16 ENCOUNTER — Other Ambulatory Visit: Payer: Self-pay | Admitting: Cardiology

## 2016-08-16 DIAGNOSIS — I480 Paroxysmal atrial fibrillation: Secondary | ICD-10-CM

## 2016-08-18 ENCOUNTER — Encounter: Payer: Self-pay | Admitting: Cardiology

## 2016-08-18 ENCOUNTER — Telehealth: Payer: Self-pay | Admitting: Cardiology

## 2016-08-18 DIAGNOSIS — R300 Dysuria: Secondary | ICD-10-CM | POA: Diagnosis not present

## 2016-08-18 NOTE — Telephone Encounter (Signed)
Spoke with patient and answered questions about CCTA scheduled 08/22/16

## 2016-08-18 NOTE — Telephone Encounter (Signed)
LMTCB

## 2016-08-18 NOTE — Telephone Encounter (Signed)
New Message    Per pt has some upcoming tests, and has some questions regarding those tests. Requesting call back from nurse.

## 2016-08-18 NOTE — Telephone Encounter (Signed)
Received [employer] report from 08/18/16 at 11:35 AM CT. Report Analysis: SA, SR, VT (6 beats). Pt asymptomatic. Pt denied CP or SOB. Pt stated he was at work. DOD Dr. Harrington Challenger reviewed - ? Aberrancy. Will forward to Dr. Meda Coffee to advise.

## 2016-08-19 NOTE — Progress Notes (Signed)
Ivy, Could you ask him if he was symptomatic at that time? It was on 1/29 at 11:33 am, Thank you, KN

## 2016-08-19 NOTE — Telephone Encounter (Signed)
I cant see the strip, is it in the clinic?

## 2016-08-19 NOTE — Telephone Encounter (Signed)
-----   Message from Dorothy Spark, MD sent at 08/19/2016  4:36 PM EST -----   ----- Message ----- From: Nuala Alpha, LPN Sent: [phone]   3:41 PM To: Dorothy Spark, MD  Here is strip

## 2016-08-19 NOTE — Telephone Encounter (Signed)
Dr Meda Coffee reviewed his strips sent by [employer] on 08/18/16.  Per Dr Meda Coffee, we should continue with cardiac event monitoring, and will base further recommendations off of the pts upcoming cardiac ct result, scheduled for Friday 08/22/16.   Notified the pt of this.  Pt verbalized understanding and agrees with this plan.

## 2016-08-19 NOTE — Telephone Encounter (Signed)
This is currently being scanned into his chart through medical records. I also have a copy of this in hand.  Dr Harrington Challenger reviewed and noted possible aberrancy and continue to monitor.  Pt states he was asymptomatic at recorded time. Pt aware of Dr Alan Ripper interpretation and we will continue to monitor.  Pt back in NSR.

## 2016-08-19 NOTE — Telephone Encounter (Signed)
Will route this message to Dr Meda Coffee as a general FYI, and to advise if needed.

## 2016-08-22 ENCOUNTER — Ambulatory Visit (HOSPITAL_COMMUNITY)
Admission: RE | Admit: 2016-08-22 | Discharge: 2016-08-22 | Disposition: A | Discharge status: Home / Self Care | Payer: 59 | Source: Ambulatory Visit | Attending: Cardiology | Admitting: Cardiology

## 2016-08-22 ENCOUNTER — Encounter (HOSPITAL_COMMUNITY): Payer: Self-pay

## 2016-08-22 DIAGNOSIS — R079 Chest pain, unspecified: Secondary | ICD-10-CM | POA: Diagnosis not present

## 2016-08-22 DIAGNOSIS — I480 Paroxysmal atrial fibrillation: Secondary | ICD-10-CM | POA: Diagnosis not present

## 2016-08-22 MED ORDER — NITROGLYCERIN 0.4 MG SL SUBL
SUBLINGUAL_TABLET | SUBLINGUAL | Status: AC
  Administered 2016-08-22: 0.4 mg via SUBLINGUAL
  Filled 2016-08-22: qty 2

## 2016-08-22 MED ORDER — NITROGLYCERIN 0.4 MG SL SUBL
0.4000 mg | SUBLINGUAL_TABLET | Freq: Once | SUBLINGUAL | Status: AC
  Administered 2016-08-22: 0.4 mg via SUBLINGUAL

## 2016-08-22 MED ORDER — IOPAMIDOL (ISOVUE-370) INJECTION 76%
INTRAVENOUS | Status: AC
  Administered 2016-08-22: 100 mL
  Filled 2016-08-22: qty 100

## 2016-08-22 NOTE — Progress Notes (Signed)
Patient given water and shortbread cookies. Patient alert resting comfortably on chair.

## 2016-08-22 NOTE — Progress Notes (Signed)
Patient tolerated water and cookies ambulated steady gait with nurse to radiology waiting room where wife was waiting.

## 2016-08-25 ENCOUNTER — Telehealth: Payer: Self-pay | Admitting: Cardiology

## 2016-08-25 NOTE — Telephone Encounter (Signed)
New Message    (pt wife) Someone called her today about her husbands test results and she couldn't answer the phone please call now if possible

## 2016-08-25 NOTE — Telephone Encounter (Signed)
F/u Message ° °Pt returning RN call. Please call back to discuss  °

## 2016-08-25 NOTE — Telephone Encounter (Signed)
I left a voicemail that at this time I cannot identify who may have been trying to reach the pt.  I reviewed chart and the pt's cardiac CTA has not been finalized by Dr Meda Coffee.

## 2016-08-25 NOTE — Telephone Encounter (Signed)
PT  AWARE  OFFICE  HAD  NOT  CALLED   UNLESS  PT  HAD  AN OLD  MESSAGE  . CARDIAC  CT   HAS  NOT BEEN FINALIZED   AS  STATED EARLIER the patient  VERBALIZED UNDERSTANDING .Adonis Housekeeper

## 2016-08-29 ENCOUNTER — Telehealth: Payer: Self-pay | Admitting: Cardiology

## 2016-08-29 NOTE — Telephone Encounter (Signed)
Pt calling in to say he pressed the button on his event monitor x 2 today, for he felt he was having consistent PVCs. The monitor company did not alert our office, about any recorded events on this pt today.  Had our monitor tech Katie, review pts current event monitor recordings from day 1 to present, and no other events were noted since his last recorded event sent to [place] on 1/29.  Informed the pt of this and advised him that we will continue to monitor, and he should continue time stamping any events that he feels is concerning.  Pt verbalized understanding and agrees with this plan.  Pt states he feels "reassured at this time."

## 2016-08-29 NOTE — Telephone Encounter (Signed)
New Message   Pt calling with c/o possible Arrythmia. Requesting call from nurse.

## 2016-09-08 ENCOUNTER — Telehealth: Payer: Self-pay | Admitting: Cardiology

## 2016-09-08 NOTE — Telephone Encounter (Signed)
New Message   Pt calling regarding taking a supplement, and wants to make sure it won't interfere with his current medications. Pt also wanted to alert nurse that he will be going to primary physician to follow up on possible cold sore on nose which is causing additional swelling to lip, and will be given medication for that and was instructed by nurse to call when put on a new medication. Requesting a call back from nurse.

## 2016-09-08 NOTE — Telephone Encounter (Signed)
PT  NOTIFIED   TO  DISCUSS  WITH  PHARMACISTS AND  TO  MAYBE GET    ANY  RECOMMENDATIONS OF  COMPANY  TO USE  AS  WELL.PT   AGREES./CY

## 2016-09-11 ENCOUNTER — Telehealth: Payer: Self-pay | Admitting: Cardiology

## 2016-09-11 NOTE — Telephone Encounter (Signed)
New message   Pt verbalized that rn call him back he has a few of questions for her

## 2016-09-11 NOTE — Telephone Encounter (Signed)
Pt calling to confirm when his lab appt will be, since starting his statin on 08/05/16.  Informed the pt that as previously scheduled, he will be coming in for lab on 3/19, and we will check fasting lipids and a cmet.  Reiterated to the pt that he need to come fasting to this lab appt.  Pt verbalized understanding and agrees with this plan.

## 2016-09-19 DIAGNOSIS — I480 Paroxysmal atrial fibrillation: Secondary | ICD-10-CM | POA: Diagnosis not present

## 2016-09-19 DIAGNOSIS — L988 Other specified disorders of the skin and subcutaneous tissue: Secondary | ICD-10-CM | POA: Diagnosis not present

## 2016-09-19 DIAGNOSIS — E784 Other hyperlipidemia: Secondary | ICD-10-CM | POA: Diagnosis not present

## 2016-10-06 ENCOUNTER — Other Ambulatory Visit: Payer: 59 | Admitting: *Deleted

## 2016-10-06 DIAGNOSIS — E784 Other hyperlipidemia: Secondary | ICD-10-CM | POA: Diagnosis not present

## 2016-10-06 DIAGNOSIS — E7849 Other hyperlipidemia: Secondary | ICD-10-CM

## 2016-10-06 LAB — COMPREHENSIVE METABOLIC PANEL
ALT: 23 IU/L (ref 0–44)
AST: 19 IU/L (ref 0–40)
Albumin/Globulin Ratio: 2.3 — ABNORMAL HIGH (ref 1.2–2.2)
Albumin: 4.5 g/dL (ref 3.5–5.5)
Alkaline Phosphatase: 80 IU/L (ref 39–117)
BUN/Creatinine Ratio: 12 (ref 9–20)
BUN: 12 mg/dL (ref 6–24)
Bilirubin Total: 0.4 mg/dL (ref 0.0–1.2)
CO2: 25 mmol/L (ref 18–29)
Calcium: 9.5 mg/dL (ref 8.7–10.2)
Chloride: 103 mmol/L (ref 96–106)
Creatinine, Ser: 1.02 mg/dL (ref 0.76–1.27)
GFR calc Af Amer: 99 mL/min/{1.73_m2} (ref >59)
GFR calc non Af Amer: 85 mL/min/{1.73_m2} (ref >59)
Globulin, Total: 2 g/dL (ref 1.5–4.5)
Glucose: 93 mg/dL (ref 65–99)
Potassium: 5.5 mmol/L — ABNORMAL HIGH (ref 3.5–5.2)
Sodium: 143 mmol/L (ref 134–144)
Total Protein: 6.5 g/dL (ref 6.0–8.5)

## 2016-10-06 LAB — LIPID PANEL
Chol/HDL Ratio: 2.7 ratio units (ref 0.0–5.0)
Cholesterol, Total: 93 mg/dL — ABNORMAL LOW (ref 100–199)
HDL: 35 mg/dL — ABNORMAL LOW (ref >39)
LDL Calculated: 42 mg/dL (ref 0–99)
Triglycerides: 82 mg/dL (ref 0–149)
VLDL Cholesterol Cal: 16 mg/dL (ref 5–40)

## 2016-10-10 ENCOUNTER — Telehealth: Payer: Self-pay | Admitting: Cardiology

## 2016-10-10 MED ORDER — ROSUVASTATIN CALCIUM 5 MG PO TABS: 5.0000 mg | ORAL_TABLET | ORAL | 3 refills | Status: DC

## 2016-10-10 NOTE — Telephone Encounter (Signed)
New message   Pt is calling with some question.   1. He is on a plant based diet. His cholesterol and other numbers are good except his potassium. If he went back to eating meat, would that correct his potassium?  2. He also wants to know if he is in danger of his levels going too high too fast. If he eats a sweet potato, that's high in potassium, would it send him over the edge?

## 2016-10-10 NOTE — Telephone Encounter (Signed)
Dr. Meda Coffee, do you have any suggestions for this pt? I've provided him with material to read on his LDL and everything you have recommended.  Provided him pt education on how to lower the K in his diet.    He's very anxious, and at times argumentative with any suggestions we have made thus far when it comes to maintaining his controlled lipids with low dose statin use, and lowering his K level by decreasing K in his diet.    We have educated him on his plant based diet and other foods that are high in potassium, and just modifying this.  Any further recommendations?

## 2016-10-10 NOTE — Telephone Encounter (Signed)
Spoke with the pt and provided more education on lowering the K in his diet and providing more education on a heart healthy, LDL lowering diet, that is not strictly plant based.  Pt has now decided he wants to take his crestor to 3 times weekly.  Made this change in the system.  Pt still very anxious and argumentative over statin use.  Will forward to Dr Meda Coffee for any further recommendations.

## 2016-10-19 ENCOUNTER — Telehealth: Payer: Self-pay | Admitting: Physician Assistant

## 2016-10-19 NOTE — Telephone Encounter (Signed)
Pt called answering service regarding advice on constipation. Has found it difficult to go today - inquiring if there are any interactions with his current medicines, atrial fib, and remedies for constipation. H/o normal renal function. I advised that safe OTC remedies would include senna (exlax), Miralax or dulcolax. I advised he can try stool softeners but mentioned that sometimes these don't help you acutely have a bowel movement but only prevent constipation. I told him he can begin taking Colace for a few days prophylactically, but for relief today, I would advise one dose of the above OTC remedies as directed. He was grateful for call.  Isador Castille PA-C

## 2016-10-23 DIAGNOSIS — E875 Hyperkalemia: Secondary | ICD-10-CM | POA: Diagnosis not present

## 2016-10-27 ENCOUNTER — Telehealth: Payer: Self-pay | Admitting: Cardiology

## 2016-10-27 NOTE — Telephone Encounter (Signed)
New Message ° ° pt verbalized that he is returning call for rn  °

## 2016-10-27 NOTE — Telephone Encounter (Signed)
Pt state that he has lost about 25 to 30 lbs being in a vegetarian diet. Now he has gone back to eating meat. He would like to know if his cardiac medication Cartia 120 mg needs to be adjusted now that he lost that wt. His heart rate in the morning before he gets up  is between 50 and 55 beats/minute, no symptoms  With this  Low rate. Also  pt  thinks that,  because of his new diet of not eating any dairy products would it affect the absorption of this medication which is a calcium channel blockers.

## 2016-10-27 NOTE — Telephone Encounter (Signed)
New message    Pt is calling because he has some questions that he would like to ask. Pt states he has lost 30 punds in the last couple months, and has some questions about his medications.

## 2016-10-27 NOTE — Telephone Encounter (Signed)
He can stop taking cardizem, we will recheck his BP and HR at the next visit.

## 2016-10-27 NOTE — Telephone Encounter (Signed)
Left pt a message to call back. 

## 2016-10-27 NOTE — Telephone Encounter (Signed)
Pt is aware he can stop taking  the Cardizem  60 mg  As needed medication. , and will recheck his BP and HR at his next office visit, which is on July 2018.

## 2016-10-28 ENCOUNTER — Encounter: Payer: 59 | Attending: Internal Medicine | Admitting: Dietician

## 2016-10-28 ENCOUNTER — Encounter: Payer: Self-pay | Admitting: Dietician

## 2016-10-28 DIAGNOSIS — E782 Mixed hyperlipidemia: Secondary | ICD-10-CM | POA: Diagnosis not present

## 2016-10-28 DIAGNOSIS — Z6823 Body mass index (BMI) 23.0-23.9, adult: Secondary | ICD-10-CM | POA: Insufficient documentation

## 2016-10-28 DIAGNOSIS — Z713 Dietary counseling and surveillance: Secondary | ICD-10-CM | POA: Diagnosis not present

## 2016-10-28 DIAGNOSIS — K219 Gastro-esophageal reflux disease without esophagitis: Secondary | ICD-10-CM | POA: Insufficient documentation

## 2016-10-28 DIAGNOSIS — E7849 Other hyperlipidemia: Secondary | ICD-10-CM

## 2016-10-28 NOTE — Progress Notes (Signed)
Medical Nutrition Therapy:  Appt start time: 1530 end time:  1640.   Assessment:  Primary concerns today: Patient is here to day alone.  He requested this visit from his MD.  He had been having a problem with hyperlipidemia with lab values as noted below.    08/04/2016: 10/06/16: Cholesterol:      193      93 HDL                 42                    35 LDL      134                   42 Triglycerides      88       82  He started on a statin ("against my preferences" per patient) and also went on a vegan diet in January for 2 months.  He lost from 205 lbs to 175 lbs in a 2 month time period.   He then had a high potassium (5.5) of unknown cause or possible lab process issue.  He states that he may not have been well hydrated at that time as well.  After that he resumed some meat and followed a lower potassium diet resulting in constipation.  Patient felt best on the plant based diet and would like to return to this.  He has read information by Dr. Cyd Silence and Dr. Manson Allan and did batch cooking with a diet high in legumes and other plant based foods.  He also avoided all fats and chose whole grains even making his own bread.  His diet hx shows that it was adequate in protein.  He reports a potassium of 4.4 on last lab work. Other hx includes a.fib and GERD but patient states that the GERD has not bothered him in years since giving up coffee and reducing fried foods.  Patient lives with his wife and 80 and 60 yo children.  One child is in college.  Patient does all of the shopping and cooking.  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  155 lbs in [employer].  Preferred Learning Style:   No preference indicated   Learning Readiness:   Ready  Change in progress   MEDICATIONS: see list   DIETARY INTAKE:  Usual eating pattern includes 3 meals and 2 snacks per day.  Everyday foods include beans, vegetables, whole grains.  Avoided foods include meat, eggs, dairy.     24-hr recall: while following the vegan diet B ( AM): oatmeal, 1 banana, raisins  Snk ( AM):   L ( PM): brown rice, vegetables, dried beans/lentils Snk ( PM): nuts or seeds D ( PM): brown rice or potatoes, vegetables, dried beans/lentils OR bean stew over rice or potato AND homemade [employer] bread made with baking powder Snk ( PM): cheerios and applesauce OR hummus on bread with kale OR air popped popcorn AND banana or prunes or apple Beverages: water with or without lemon, seltzer water, occasional almond milk  Usual physical activity: Walks 30 minutes 5 times per week and has been trying to run until potassium was elevated and lifts weights 2-3 times per week.  Estimated energy needs: 2200 calories 80 g protein 24g fat (minimum)  Progress Towards Goal(s):  In progress.   Nutritional Diagnosis:  NB-1.1 Food and nutrition-related knowledge deficit As related to vegan eating as well as potassium concerns.  As evidenced by patient report.    Intervention:  Nutrition counseling/education related to vegan diet.  Recommended a sublingual vitamin B-12 supplement most days.  Discussed basic meal planning, calcium, milk alternatives and resources.  Discussed vegan eating and children/teens.  Provided resources for nutritional information, meal planning and recipes.  Discussed potassium and reviewed MD notes regarding his level.  If it continues to be elevated he may need to adjust potassium intake.  Provided patient with an app to find nutritional information as well as a low potassium list.  Discussed nutrition recommendations to help maintain his weight.  Brought our RD intern (specializing in [place] based nutrition) in to also give further resources and discussion.  References:  Becoming Vegan book by Ms. Rosana Hoes RD  pcrm (physician's committee for responsible medicine)  Forks over [employer]  Dr. Emilio Math books  Dr. Azalee Course  [employer]  Nutrition Facts.org      Engine2 diet  Calorie [employer] app  Teaching Method Utilized:  Ship broker  Handouts given during visit include:  Low potassium list  Potassium list of foods  Vegetarian nutrition for teens  Barriers to learning/adherence to lifestyle change: none  Demonstrated degree of understanding via:  Teach Back   Monitoring/Evaluation:  Dietary intake, exercise, and body weight prn.

## 2016-10-28 NOTE — Patient Instructions (Addendum)
References:  Becoming Vegan book by Ms. Rosana Hoes RD  pcrm (physician's committee for responsible medicine)  Forks over [employer]  Dr. Emilio Math books  Dr. Azalee Course  [employer]  Nutrition Facts.org     Engine2 diet  Calorie [employer]

## 2016-10-31 ENCOUNTER — Telehealth: Payer: Self-pay | Admitting: Cardiology

## 2016-10-31 DIAGNOSIS — L03011 Cellulitis of right finger: Secondary | ICD-10-CM | POA: Diagnosis not present

## 2016-10-31 NOTE — Telephone Encounter (Signed)
Pt called to make sure it was ok to take doxycyline with his cartia asa and crestor,  Given ok.

## 2016-11-14 ENCOUNTER — Ambulatory Visit: Payer: 59 | Admitting: Registered"

## 2016-11-27 DIAGNOSIS — Z0000 Encounter for general adult medical examination without abnormal findings: Secondary | ICD-10-CM | POA: Diagnosis not present

## 2016-12-02 DIAGNOSIS — Z125 Encounter for screening for malignant neoplasm of prostate: Secondary | ICD-10-CM | POA: Diagnosis not present

## 2016-12-02 DIAGNOSIS — E784 Other hyperlipidemia: Secondary | ICD-10-CM | POA: Diagnosis not present

## 2016-12-02 DIAGNOSIS — Z0000 Encounter for general adult medical examination without abnormal findings: Secondary | ICD-10-CM | POA: Diagnosis not present

## 2016-12-02 DIAGNOSIS — Z1389 Encounter for screening for other disorder: Secondary | ICD-10-CM | POA: Diagnosis not present

## 2016-12-02 DIAGNOSIS — I480 Paroxysmal atrial fibrillation: Secondary | ICD-10-CM | POA: Diagnosis not present

## 2016-12-02 DIAGNOSIS — E875 Hyperkalemia: Secondary | ICD-10-CM | POA: Diagnosis not present

## 2016-12-03 DIAGNOSIS — Z1212 Encounter for screening for malignant neoplasm of rectum: Secondary | ICD-10-CM | POA: Diagnosis not present

## 2017-02-12 ENCOUNTER — Other Ambulatory Visit: Payer: Self-pay | Admitting: Cardiology

## 2017-02-12 DIAGNOSIS — I480 Paroxysmal atrial fibrillation: Secondary | ICD-10-CM

## 2017-03-03 DIAGNOSIS — Z6824 Body mass index (BMI) 24.0-24.9, adult: Secondary | ICD-10-CM | POA: Diagnosis not present

## 2017-03-03 DIAGNOSIS — L0889 Other specified local infections of the skin and subcutaneous tissue: Secondary | ICD-10-CM | POA: Diagnosis not present

## 2017-06-27 ENCOUNTER — Telehealth: Payer: Self-pay | Admitting: Cardiology

## 2017-06-27 NOTE — Telephone Encounter (Signed)
Paged by answering service, returned call to patient. He reports a 2.5 week history of rash/bumps across his torso, unclear inciting factor (he was working in the yard, he questions poison ivy). He is calling to ask if antihistamine is ok with his history of atrial fibrillation. He denies shortness of breath, tongue/lip swelling, wheezing, etc. He feels well overall but has not been able to get to a doctor yet to look at the rash. Gave him the ok for antihistamines, instructed him on red flag warning signs of anaphylaxis that warrant urgent medication attention. He understands and agrees.  Buford Dresser, MD, PhD, overnight cardiology provider

## 2017-07-07 DIAGNOSIS — R21 Rash and other nonspecific skin eruption: Secondary | ICD-10-CM | POA: Diagnosis not present

## 2017-08-06 ENCOUNTER — Other Ambulatory Visit: Payer: Self-pay | Admitting: Cardiology

## 2017-08-24 ENCOUNTER — Ambulatory Visit (INDEPENDENT_AMBULATORY_CARE_PROVIDER_SITE_OTHER): Payer: 59 | Admitting: Cardiology

## 2017-08-24 ENCOUNTER — Encounter: Payer: Self-pay | Admitting: Cardiology

## 2017-08-24 VITALS — BP 116/68 | HR 62 | Ht 72.0 in | Wt 175.4 lb

## 2017-08-24 DIAGNOSIS — I480 Paroxysmal atrial fibrillation: Secondary | ICD-10-CM | POA: Diagnosis not present

## 2017-08-24 DIAGNOSIS — E7849 Other hyperlipidemia: Secondary | ICD-10-CM

## 2017-08-24 DIAGNOSIS — I10 Essential (primary) hypertension: Secondary | ICD-10-CM

## 2017-08-24 LAB — COMPREHENSIVE METABOLIC PANEL
ALT: 27 IU/L (ref 0–44)
AST: 23 IU/L (ref 0–40)
Albumin/Globulin Ratio: 1.8 (ref 1.2–2.2)
Albumin: 4.4 g/dL (ref 3.5–5.5)
Alkaline Phosphatase: 77 IU/L (ref 39–117)
BUN/Creatinine Ratio: 13 (ref 9–20)
BUN: 14 mg/dL (ref 6–24)
Bilirubin Total: 0.3 mg/dL (ref 0.0–1.2)
CO2: 24 mmol/L (ref 20–29)
Calcium: 9.3 mg/dL (ref 8.7–10.2)
Chloride: 104 mmol/L (ref 96–106)
Creatinine, Ser: 1.04 mg/dL (ref 0.76–1.27)
GFR calc Af Amer: 96 mL/min/{1.73_m2} (ref >59)
GFR calc non Af Amer: 83 mL/min/{1.73_m2} (ref >59)
Globulin, Total: 2.4 g/dL (ref 1.5–4.5)
Glucose: 63 mg/dL — ABNORMAL LOW (ref 65–99)
Potassium: 4.9 mmol/L (ref 3.5–5.2)
Sodium: 143 mmol/L (ref 134–144)
Total Protein: 6.8 g/dL (ref 6.0–8.5)

## 2017-08-24 LAB — LIPID PANEL
Chol/HDL Ratio: 2.4 ratio (ref 0.0–5.0)
Cholesterol, Total: 107 mg/dL (ref 100–199)
HDL: 44 mg/dL (ref >39)
LDL Calculated: 42 mg/dL (ref 0–99)
Triglycerides: 104 mg/dL (ref 0–149)
VLDL Cholesterol Cal: 21 mg/dL (ref 5–40)

## 2017-08-24 MED ORDER — DILTIAZEM HCL 60 MG PO TABS: 60.0000 mg | ORAL_TABLET | ORAL | 6 refills | Status: DC

## 2017-08-24 NOTE — Progress Notes (Signed)
Cardiology Office Note:    Date:  08/24/2017   ID:  Nathan Harrison, DOB 1965-12-14, MRN [id number]  PCP:  Shon Baton, MD  Cardiologist:  No primary care provider on file.   Referring MD: Shon Baton, MD   Chief Complaint  Patient presents with  . 6 month follow up   History of Present Illness:    Nathan Harrison is a 52 y.o. male with a hx of h/o GERD, hiatal and inguinal hearnia repair who was referred to [place] for an urgent visit after new diagnosis of atrial fibrillation with RVR. He went to the urgent care with HR 130 BPM and spontaneously cardioverted to SR few hours later. He states that he might have had more episodes around the time of surgery in January. He never searched medical care then. He describes frequent palpitations consistent with PVCs. This Monday he felt sudden onset palpitations with some anxiety and mild associated SOB, no presyncope or syncope. He has no other medical history like HTN, lipids are followed by his PCP, he doesn't drink alcohol but chews tobacco excessively since age of 51. His mother has a-fib and unknown valvular disease, his grandmother has valvular disease and h/o stroke. He denies any other symptoms such as Chest pain, DOE, LE edema, orthopnea. He works as a [employer] manager, and works out.   One year follow up, no more palpitations, feels well, quit tobacco, etoh, chocolate. He is active and denies any limitations, no chest pain, or DOE. No claudications.  He didn't need to use pill in the pocket.   07/31/2016 - the patient had another episode of atrial fibrillation while at work, he called yesterday sounding very anxious and stated that while at work he started feeling extra beats, and feeling uneasy, he went to his car,  when he started to feel fast palpitations without associated dizziness but mild chest tightness. He took a pill of Cardizem 60 mg pill that he carries with him an episode resolved within 5 minutes. He states that he was probably  dehydrated and he was sleep deprived yesterday. Since the visit a year ago this is the first episode of atrial fibrillation, he otherwise denies exertional chest pain or shortness of breath. He performs heavy weightlifting and exercising at home on a regular basis and doesn't have any symptoms of chest pain or shortness of breath. He also denies any syncope. He is compliant with aspirin and diltiazem. Denies any lower extremity edema.  08/24/17 - this is one year follow-up, the patient states that he feels great, he hasn't had any episodes of palpitations in the last year. No syncope. No chest pain shortness of breath lower extremity edema. He has been compliant to his medication and has no side effects. His blood pressure has been rather low and he has episodes of orthostatic hypotension when he gets up but no syncope.  Past Medical History:  Diagnosis Date  . Allergy   . Anxiety     Past Surgical History:  Procedure Laterality Date  . HERNIA REPAIR    . WISDOM TOOTH EXTRACTION      Current Medications: Current Meds  Medication Sig  . rosuvastatin (CRESTOR) 5 MG tablet Take 5 mg by mouth every other day.  . [DISCONTINUED] aspirin EC 81 MG tablet Take 1 tablet (81 mg total) by mouth daily.  . [DISCONTINUED] CARTIA XT 120 MG 24 hr capsule TAKE 1 CAPSULE BY MOUTH EVERY DAY     Allergies:   Tuberculin tests   Social  History   Socioeconomic History  . Marital status: Married    Spouse name: None  . Number of children: None  . Years of education: None  . Highest education level: None  Social Needs  . Financial resource strain: None  . Food insecurity - worry: None  . Food insecurity - inability: None  . Transportation needs - medical: None  . Transportation needs - non-medical: None  Occupational History  . None  Tobacco Use  . Smoking status: Never Smoker  . Smokeless tobacco: Former Systems developer    Types: Snuff  Substance and Sexual Activity  . Alcohol use: No  . Drug use: No  .  Sexual activity: Yes    Birth control/protection: Condom  Other Topics Concern  . None  Social History Narrative  . None     Family History: [name] family history includes Arthritis in his brother, father, and mother; Hyperlipidemia in his father. There is no history of Heart attack.  ROS:   Please see the history of present illness.    All other systems reviewed and are negative.  EKGs/Labs/Other Studies Reviewed:    The following studies were reviewed today:  EKG:  EKG is  ordered today.  The ekg ordered today demonstrates normal sinus rhythm, normal EKG unchanged from prior.  Recent Labs: 10/06/2016: ALT 23; BUN 12; Creatinine, Ser 1.02; Potassium 5.5; Sodium 143  Recent Lipid Panel    Component Value Date/Time   CHOL 93 (L) 10/06/2016 0858   TRIG 82 10/06/2016 0858   HDL 35 (L) 10/06/2016 0858   CHOLHDL 2.7 10/06/2016 0858   LDLCALC 42 10/06/2016 0858   Physical Exam:    VS:  BP 116/68   Pulse 62   Ht 6' (1.829 m)   Wt 175 lb 6.4 oz (79.6 kg)   BMI 23.79 kg/m     Wt Readings from Last 3 Encounters:  08/24/17 175 lb 6.4 oz (79.6 kg)  10/28/16 175 lb (79.4 kg)  07/31/16 205 lb 6.4 oz (93.2 kg)    GEN:  Well nourished, well developed in no acute distress HEENT: Normal NECK: No JVD; No carotid bruits LYMPHATICS: No lymphadenopathy CARDIAC: RRR, no murmurs, rubs, gallops RESPIRATORY:  Clear to auscultation without rales, wheezing or rhonchi  ABDOMEN: Soft, non-tender, non-distended MUSCULOSKELETAL:  No edema; No deformity  SKIN: Warm and dry NEUROLOGIC:  Alert and oriented x 3 PSYCHIATRIC:  Normal affect   ASSESSMENT:    1. Other hyperlipidemia   2. PAF (paroxysmal atrial fibrillation) (HCC)    PLAN:    In order of problems listed above:  1. Paroxysmal atrial fibrillation - normal echo with normal LV EF and S1 function and normal size of the left atrium. - CHADS-VASc - 1 - only ASA 81 mg po daily, I will discontinue as well as it was 1  isolated episode after hernia repair - disontinue Diltiazem CD 120 mg po daily - 48 holter monitor showed PVCs only  2. Hypertension - d/c cardizem as he developed metastatic hypotension, he is advised to check his blood pressure if his blood pressure goes over 130/80 we'll restart.  3. Lipids - on rosuvastatin 5 mg 3 times a week, with significant improvement of his LDL from 132->42, his calcium score was 0 year ago, we'll recheck his lipids today and potentially decreased to rosuvastatin 5 mg twice a week.  4. Chewing tobacco - quit as well as etoh (was not excessive), chocolate  Follow-up in 1 year.  Medication Adjustments/Labs and  Tests Ordered: Current medicines are reviewed at length with the patient today.  Concerns regarding medicines are outlined above.  Orders Placed This Encounter  Procedures  . Comp Met (CMET)  . Lipid Profile  . EKG 12-Lead   Meds ordered this encounter  Medications  . diltiazem (CARDIZEM) 60 MG tablet    Sig: Take 1 tablet (60 mg total) by mouth as directed. For A Fib    Dispense:  30 tablet    Refill:  6    Signed, Ena Dawley, MD  08/24/2017 9:50 AM    [place]

## 2017-08-24 NOTE — Patient Instructions (Addendum)
Medication Instructions:   STOP TAKING ASPRIN 81 MG NOW  STOP TAKING CARTIA XT NOW    Labwork:  TODAY--CMET AND LIPIDS      Follow-Up:  Your physician wants you to follow-up in: [place] will receive a reminder letter in the mail two months in advance. If you don't receive a letter, please call our office to schedule the follow-up appointment.        If you need a refill on your cardiac medications before your next appointment, please call your pharmacy.

## 2017-08-25 ENCOUNTER — Telehealth: Payer: Self-pay | Admitting: *Deleted

## 2017-08-25 MED ORDER — ROSUVASTATIN CALCIUM 5 MG PO TABS: 5.0000 mg | ORAL_TABLET | ORAL | 4 refills | Status: DC

## 2017-08-25 NOTE — Telephone Encounter (Signed)
Notified the pt that per Dr Meda Coffee, his labs showed normal creatinine, electrolytes, lipids, and she recommends that we decrease his Crestor to 5 mg po twice weekly, take on Tuesdays and Fridays.  Confirmed the pharmacy of choice with the pt.  Pt verbalized understanding and agrees with this plan.

## 2017-08-25 NOTE — Telephone Encounter (Signed)
-----   Message from Dorothy Spark, MD sent at 08/25/2017 10:59 AM EST ----- Twice weekly like Tu and Fri  ----- Message ----- From: Nuala Alpha, LPN Sent: 10/23/4096  11:91 AM To: Dorothy Spark, MD  Dr. Meda Coffee can you clarify on Crestor dose.  He's taking 5 mg po every other day right now. What do you want him to decrease this to?  Only twice weekly? Thanks,  Karlene Einstein   ----- Message ----- From: Dorothy Spark, MD Sent: 08/25/2017   8:53 AM To: Nuala Alpha, LPN  Normal crea, electrolytes, lipids, decrease crestor 5 mg po BID.

## 2017-10-12 DIAGNOSIS — T781XXA Other adverse food reactions, not elsewhere classified, initial encounter: Secondary | ICD-10-CM | POA: Diagnosis not present

## 2017-10-12 DIAGNOSIS — L299 Pruritus, unspecified: Secondary | ICD-10-CM | POA: Diagnosis not present

## 2017-10-12 DIAGNOSIS — R21 Rash and other nonspecific skin eruption: Secondary | ICD-10-CM | POA: Diagnosis not present

## 2017-11-17 ENCOUNTER — Other Ambulatory Visit: Payer: Self-pay | Admitting: Surgery

## 2017-11-17 DIAGNOSIS — K429 Umbilical hernia without obstruction or gangrene: Secondary | ICD-10-CM | POA: Diagnosis not present

## 2017-11-24 IMAGING — CT CT HEART MORP W/ CTA COR W/ SCORE W/ CA W/CM &/OR W/O CM
1 of 10 series · 1 of 20 positions shown, 2 images · non-contrast
Comparison: None.

CLINICAL DATA: 50 -year-old male with h/o paroxysmal atrial
fibrillation and atypical chest pain. nsVT on event monitoring.

EXAM:
Cardiac/Coronary  CT
TECHNIQUE: The patient was scanned on a Philips 256 scanner.

[Series 300: locator · axial · 0.35mm/px · z∈[+756,+756]mm · 1 of 1 slices shown, 2 images]
[im 1/1  vessel]
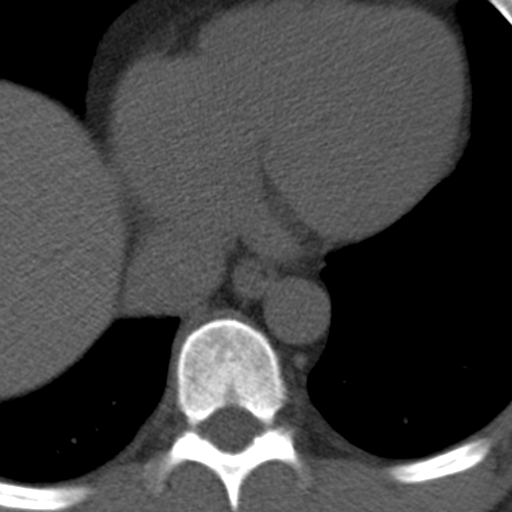
[im 1/1  lung]
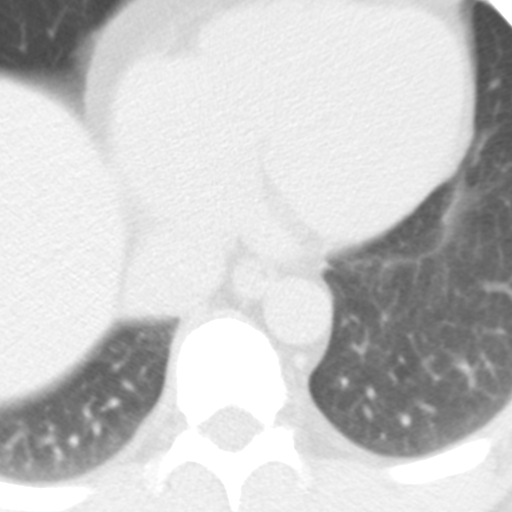

[1 of 20 positions shown; findings below may reference images not displayed]

FINDINGS: A 120 kV prospective scan was triggered in the descending thoracic
aorta at 111 HU's. Axial non-contrast 3 mm slices were carried out
through the heart. The data set was analyzed on a dedicated work
station and scored using the Agatson method. Gantry rotation speed
was 270 msecs and collimation was .9 mm. 5 mg of iv Metoprolol and
0.8 mg of sl NTG was given. The 3D data set was reconstructed in 5%
intervals of the 67-82 % of the R-R cycle. Diastolic phases were
analyzed on a dedicated work station using MPR, MIP and VRT modes.
The patient received 80 cc of contrast.

Aorta:  Normal size.  No calcifications.  No dissection.

Aortic Valve:  Trileaflet.  No calcifications.

Coronary Arteries: Normal coronary origin. Right dominance. This
study is affected by significant motion in the proximal RCA and LAD
arteries. There is no plaque in the visualized portions of those
vessels.

RCA is a large dominant artery that gives rise to PDA and PLVB.

LM has no plaque.

LCX is a non-dominant artery that gives rise to one large OM1
branch. There is no plaque.

Other findings:

Normal pulmonary vein drainage into the left atrium.

Normal let atrial appendage without a thrombus.

Normal size of the pulmonary artery.

No ASD/VSD.
IMPRESSION: 1. Coronary calcium score of 0. This was 0 percentile for age and
sex matched control.

2. Normal coronary origin with right dominance. This study is
affected by significant motion in the proximal RCA and LAD arteries.
There is no plaque in the visualized portions of those vessels. No
plaque in LCx artery.

3. Most probably normal study, however significant motion artifact.
Because of that and the fact that the patient is having nsVTs on
event monitor a cardiac stress MRI is recommended for further
evaluation.

Balthazar Serafim Milov

EXAM:
OVER-READ INTERPRETATION  CT CHEST

The following report is an over-read performed by radiologist Dr.
Blade Aujla [REDACTED] on 08/22/2016. This over-read
does not include interpretation of cardiac or coronary anatomy or
pathology. The coronary CTA interpretation by the cardiologist is
attached.
FINDINGS: Cardiovascular: Heart is normal size. Visualized aorta is normal
caliber.

Mediastinum/Nodes: No mediastinal, hilar, or axillary adenopathy.

Lungs/Pleura: Visualized lungs are clear.  No effusions.

Upper Abdomen: Imaging into the upper abdomen shows no acute
findings.

Musculoskeletal: Chest wall soft tissues are unremarkable. No acute
bony abnormality.
IMPRESSION: No acute or significant extracardiac abnormality.

## 2017-11-27 DIAGNOSIS — R946 Abnormal results of thyroid function studies: Secondary | ICD-10-CM | POA: Diagnosis not present

## 2017-11-27 DIAGNOSIS — Z125 Encounter for screening for malignant neoplasm of prostate: Secondary | ICD-10-CM | POA: Diagnosis not present

## 2017-11-27 DIAGNOSIS — Z0000 Encounter for general adult medical examination without abnormal findings: Secondary | ICD-10-CM | POA: Diagnosis not present

## 2017-12-04 DIAGNOSIS — E7849 Other hyperlipidemia: Secondary | ICD-10-CM | POA: Diagnosis not present

## 2017-12-04 DIAGNOSIS — Z125 Encounter for screening for malignant neoplasm of prostate: Secondary | ICD-10-CM | POA: Diagnosis not present

## 2017-12-04 DIAGNOSIS — Z0000 Encounter for general adult medical examination without abnormal findings: Secondary | ICD-10-CM | POA: Diagnosis not present

## 2017-12-04 DIAGNOSIS — I480 Paroxysmal atrial fibrillation: Secondary | ICD-10-CM | POA: Diagnosis not present

## 2017-12-04 DIAGNOSIS — Z1389 Encounter for screening for other disorder: Secondary | ICD-10-CM | POA: Diagnosis not present

## 2017-12-04 DIAGNOSIS — Z889 Allergy status to unspecified drugs, medicaments and biological substances status: Secondary | ICD-10-CM | POA: Diagnosis not present

## 2018-04-24 ENCOUNTER — Encounter (HOSPITAL_COMMUNITY): Payer: Self-pay

## 2018-04-24 ENCOUNTER — Emergency Department (HOSPITAL_COMMUNITY)
Admission: EM | Admit: 2018-04-24 | Discharge: 2018-04-24 | Disposition: A | Discharge status: Home / Self Care | Payer: 59 | Attending: Emergency Medicine | Admitting: Emergency Medicine

## 2018-04-24 ENCOUNTER — Other Ambulatory Visit: Payer: Self-pay

## 2018-04-24 DIAGNOSIS — Z79899 Other long term (current) drug therapy: Secondary | ICD-10-CM | POA: Insufficient documentation

## 2018-04-24 DIAGNOSIS — R001 Bradycardia, unspecified: Secondary | ICD-10-CM | POA: Diagnosis not present

## 2018-04-24 DIAGNOSIS — R42 Dizziness and giddiness: Secondary | ICD-10-CM | POA: Insufficient documentation

## 2018-04-24 DIAGNOSIS — I10 Essential (primary) hypertension: Secondary | ICD-10-CM | POA: Diagnosis not present

## 2018-04-24 DIAGNOSIS — I959 Hypotension, unspecified: Secondary | ICD-10-CM | POA: Diagnosis not present

## 2018-04-24 DIAGNOSIS — Z87891 Personal history of nicotine dependence: Secondary | ICD-10-CM | POA: Insufficient documentation

## 2018-04-24 DIAGNOSIS — R231 Pallor: Secondary | ICD-10-CM | POA: Diagnosis not present

## 2018-04-24 HISTORY — DX: Unspecified atrial fibrillation: I48.91

## 2018-04-24 LAB — BASIC METABOLIC PANEL
ANION GAP: 7 (ref 5–15)
BUN: 19 mg/dL (ref 6–20)
CO2: 27 mmol/L (ref 22–32)
Calcium: 8.7 mg/dL — ABNORMAL LOW (ref 8.9–10.3)
Chloride: 103 mmol/L (ref 98–111)
Creatinine, Ser: 1 mg/dL (ref 0.61–1.24)
GFR calc Af Amer: >60 mL/min (ref >60)
Glucose, Bld: 107 mg/dL — ABNORMAL HIGH (ref 70–99)
POTASSIUM: 4 mmol/L (ref 3.5–5.1)
SODIUM: 137 mmol/L (ref 135–145)

## 2018-04-24 LAB — CBC WITH DIFFERENTIAL/PLATELET
ABS IMMATURE GRANULOCYTES: 0 10*3/uL (ref 0.0–0.1)
BASOS ABS: 0 10*3/uL (ref 0.0–0.1)
BASOS PCT: 0 %
Eosinophils Absolute: 0 10*3/uL (ref 0.0–0.7)
Eosinophils Relative: 1 %
HCT: 42.7 % (ref 39.0–52.0)
HEMOGLOBIN: 14.2 g/dL (ref 13.0–17.0)
Immature Granulocytes: 0 %
LYMPHS PCT: 32 %
Lymphs Abs: 1.5 10*3/uL (ref 0.7–4.0)
MCH: 32.5 pg (ref 26.0–34.0)
MCHC: 33.3 g/dL (ref 30.0–36.0)
MCV: 97.7 fL (ref 78.0–100.0)
Monocytes Absolute: 0.3 10*3/uL (ref 0.1–1.0)
Monocytes Relative: 6 %
NEUTROS ABS: 2.9 10*3/uL (ref 1.7–7.7)
Neutrophils Relative %: 61 %
PLATELETS: 152 10*3/uL (ref 150–400)
RBC: 4.37 MIL/uL (ref 4.22–5.81)
RDW: 11.7 % (ref 11.5–15.5)
WBC: 4.8 10*3/uL (ref 4.0–10.5)

## 2018-04-24 LAB — MAGNESIUM: MAGNESIUM: 2.3 mg/dL (ref 1.7–2.4)

## 2018-04-24 MED ORDER — ONDANSETRON HCL 4 MG PO TABS: 4.0000 mg | ORAL_TABLET | Freq: Four times a day (QID) | ORAL | 0 refills | Status: DC

## 2018-04-24 MED ORDER — MECLIZINE HCL 25 MG PO TABS: 25.0000 mg | ORAL_TABLET | Freq: Three times a day (TID) | ORAL | 0 refills | Status: DC | PRN

## 2018-04-24 NOTE — Discharge Instructions (Signed)
Return for any problem.  Follow-up with your regular doctors as instructed.

## 2018-04-24 NOTE — ED Triage Notes (Signed)
Pt brought in by [facility] from work for dizziness and what patient states feels like "a slow, weak, and irregular heart rate". 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 Pt states what concerned him the most was the irregularity, states has hx of afib, was on cardizem until a few months ago. Pt states his cardiologist took him off the medication because he "was getting better". Pt also endorses blurred vision on standing. Pt denies LOC. Pt A+Ox4 and in NAD on arrival. EDP at bedside.

## 2018-04-24 NOTE — ED Provider Notes (Signed)
[facility] EMERGENCY DEPARTMENT Provider Note   CSN: [id number] Arrival date & time: 04/24/18  1235     History   Chief Complaint Chief Complaint  Patient presents with  . Dizziness  . Bradycardia    HPI Nathan Harrison is a 52 y.o. male.  52 year old male with prior medical history as detailed below presents with complaint of dizziness.  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  During his work today he stood up quickly.  He experienced a dizziness sensation immediately after standing quickly.  This lasted approximately 30 minutes.  He did not pass out.  He denies associated chest pain.  He denies shortness of breath or palpitations.  He does report a prior history of one episode of paroxysmal A. fib.  He has had no recurrent episodes of A. fib.  His symptoms have now resolved.  He denies current dizziness or other complaint.  The history is provided by the patient and medical records.  Dizziness  Quality:  Head spinning and vertigo Severity:  Mild Onset quality:  Sudden Duration: 30 minutes. Timing:  Rare Progression:  Resolved Chronicity:  New Context: bending over, head movement and standing up   Context: not with loss of consciousness   Relieved by:  Nothing Worsened by:  Nothing Ineffective treatments:  None tried   Past Medical History:  Diagnosis Date  . A-fib ([place])   . Allergy   . Anxiety     Patient Active Problem List   Diagnosis Date Noted  . Hyperlipidemia 08/01/2016  . Fatigue 07/31/2016  . PAF (paroxysmal atrial fibrillation) ([place]) 07/31/2016  . HTN (hypertension) 07/31/2016  . Atrial fibrillation ([place]) 04/05/2014  . Heart palpitations 04/05/2014  . Anxiety 04/05/2014  . Tobacco chew use 04/05/2014  . Bilateral inguinal hernia 07/02/2012  . Umbilical hernia 52/77/8242    Past Surgical History:  Procedure Laterality Date  . HERNIA REPAIR    . WISDOM TOOTH EXTRACTION          Home Medications     Prior to Admission medications   Medication Sig Start Date End Date Taking? Authorizing Provider  diltiazem (CARDIZEM) 60 MG tablet Take 1 tablet (60 mg total) by mouth as directed. For A Fib 08/24/17   Dorothy Spark, MD  rosuvastatin (CRESTOR) 5 MG tablet Take 1 tablet (5 mg total) by mouth 2 (two) times a week. Take on Tuesday and Friday 08/27/17   Dorothy Spark, MD    Family History Family History  Problem Relation Age of Onset  . Arthritis Mother   . Arthritis Father   . Hyperlipidemia Father   . Arthritis Brother   . Heart attack Neg Hx     Social History Social History   Tobacco Use  . Smoking status: Never Smoker  . Smokeless tobacco: Former Systems developer    Types: Snuff  Substance Use Topics  . Alcohol use: No  . Drug use: No     Allergies   Tuberculin tests   Review of Systems Review of Systems  Neurological: Positive for dizziness.  All other systems reviewed and are negative.    Physical Exam Updated Vital Signs Ht 5\' 11"  (1.803 m)   Wt 77.1 kg   SpO2 100%   BMI 23.71 kg/m   Physical Exam  Constitutional: He is oriented to person, place, and time. He appears well-developed and well-nourished. No distress.  HENT:  Head: Normocephalic and atraumatic.  Mouth/Throat: Oropharynx is clear and moist.  Eyes: Pupils  are equal, round, and reactive to light. Conjunctivae and EOM are normal.  Neck: Normal range of motion. Neck supple.  Cardiovascular: Normal rate, regular rhythm and normal heart sounds.  Pulmonary/Chest: Effort normal and breath sounds normal. No respiratory distress.  Abdominal: Soft. He exhibits no distension. There is no tenderness.  Musculoskeletal: Normal range of motion. He exhibits no edema or deformity.  Neurological: He is alert and oriented to person, place, and time. He displays normal reflexes. No cranial nerve deficit or sensory deficit. He exhibits normal muscle tone. Coordination normal.  Skin: Skin is warm and dry.   Psychiatric: He has a normal mood and affect.  Nursing note and vitals reviewed.    ED Treatments / Results  Labs (all labs ordered are listed, but only abnormal results are displayed) Labs Reviewed  BASIC METABOLIC PANEL - Abnormal; Notable for the following components:      Result Value   Glucose, Bld 107 (*)    Calcium 8.7 (*)    All other components within normal limits  CBC WITH DIFFERENTIAL/PLATELET  MAGNESIUM    EKG EKG Interpretation  Date/Time:  Saturday April 24 2018 12:39:56 EDT Ventricular Rate:  49 PR Interval:    QRS Duration: 86 QT Interval:  430 QTC Calculation: 389 R Axis:   75 Text Interpretation:  Sinus bradycardia Anterior infarct, old Confirmed by Dene Gentry [phone]) on 04/24/2018 12:48:07 PM   Radiology No results found.  Procedures Procedures (including critical care time)  Medications Ordered in ED Medications - No data to display   Initial Impression / Assessment and Plan / ED Course  I have reviewed the triage vital signs and the nursing notes.  Pertinent labs & imaging results that were available during my care of the patient were reviewed by me and considered in my medical decision making (see chart for details).     MDM  Screen complete  Patient is presenting for evaluation of transient dizziness.  Symptoms are most consistent with likely benign positional vertigo.  Screening labs in the ED do not reveal significant pathology.  Patient appears to be stable for discharge.  Patient understands need for close follow-up.  Strict return precautions are given and understood.   Final Clinical Impressions(s) / ED Diagnoses   Final diagnoses:  Vertigo    ED Discharge Orders         Ordered    meclizine (ANTIVERT) 25 MG tablet  3 times daily PRN     04/24/18 1417    ondansetron (ZOFRAN) 4 MG tablet  Every 6 hours     04/24/18 1417           Valarie Merino, MD 04/24/18 1418

## 2018-04-24 NOTE — ED Notes (Signed)
Patient verbalizes understanding of discharge instructions. Opportunity for questioning and answers were provided. Armband removed by staff, pt discharged from ED.  

## 2018-04-26 DIAGNOSIS — J301 Allergic rhinitis due to pollen: Secondary | ICD-10-CM | POA: Diagnosis not present

## 2018-04-26 DIAGNOSIS — J3081 Allergic rhinitis due to animal (cat) (dog) hair and dander: Secondary | ICD-10-CM | POA: Diagnosis not present

## 2018-04-26 DIAGNOSIS — R05 Cough: Secondary | ICD-10-CM | POA: Diagnosis not present

## 2018-04-26 DIAGNOSIS — J3089 Other allergic rhinitis: Secondary | ICD-10-CM | POA: Diagnosis not present

## 2018-04-28 DIAGNOSIS — H6123 Impacted cerumen, bilateral: Secondary | ICD-10-CM | POA: Diagnosis not present

## 2018-04-28 DIAGNOSIS — Z6823 Body mass index (BMI) 23.0-23.9, adult: Secondary | ICD-10-CM | POA: Diagnosis not present

## 2018-04-28 DIAGNOSIS — H8110 Benign paroxysmal vertigo, unspecified ear: Secondary | ICD-10-CM | POA: Diagnosis not present

## 2018-06-23 DIAGNOSIS — L821 Other seborrheic keratosis: Secondary | ICD-10-CM | POA: Diagnosis not present

## 2018-06-23 DIAGNOSIS — D1801 Hemangioma of skin and subcutaneous tissue: Secondary | ICD-10-CM | POA: Diagnosis not present

## 2018-06-23 DIAGNOSIS — L814 Other melanin hyperpigmentation: Secondary | ICD-10-CM | POA: Diagnosis not present

## 2018-08-29 ENCOUNTER — Other Ambulatory Visit: Payer: Self-pay | Admitting: Cardiology

## 2018-08-31 ENCOUNTER — Other Ambulatory Visit: Payer: Self-pay | Admitting: Cardiology

## 2018-09-27 ENCOUNTER — Other Ambulatory Visit: Payer: Self-pay | Admitting: Cardiology

## 2018-09-27 MED ORDER — ROSUVASTATIN CALCIUM 5 MG PO TABS: 5.0000 mg | ORAL_TABLET | ORAL | 0 refills | Status: DC

## 2018-09-27 NOTE — Telephone Encounter (Signed)
 *  STAT* If patient is at the pharmacy, call can be transferred to refill team.   1. Which medications need to be refilled? (please list name of each medication and dose if known) rosuvastatin (CRESTOR) 5 MG tablet  2. Which pharmacy/location (including street and city if local pharmacy) is medication to be sent to? [facility], [address] and [address], [place]   3. Do they need a 30 day or 90 day supply? 30  Patient has appt with Ermalinda Barrios 10/19/18

## 2018-10-02 ENCOUNTER — Other Ambulatory Visit: Payer: Self-pay | Admitting: Cardiology

## 2018-10-02 ENCOUNTER — Telehealth: Payer: Self-pay | Admitting: Cardiology

## 2018-10-02 MED ORDER — DILTIAZEM HCL 60 MG PO TABS: 60.0000 mg | ORAL_TABLET | ORAL | 6 refills | Status: DC

## 2018-10-02 NOTE — Telephone Encounter (Signed)
I returned a call to the patient. He reports that he has experienced a recurrent of his AF. He has reports that his HR has been in the in the 120s. His BP has is 132/80. He notices his heart racing but he denies chest pain or shortness of breath.   He has diltiazem at home that he has not been taking regularly. He was encouraged to take a low dose (60 mg) and watch his symptoms. If his BP remains stable and HR is not controlled after several hours he can take another dose of diltiazem. He notably had CHADS2VASC of 1 by prior cardiology clinic assessment.  He was encouraged to call his cardiology office in the morning to set up appointment.

## 2018-10-03 DIAGNOSIS — I4891 Unspecified atrial fibrillation: Secondary | ICD-10-CM | POA: Diagnosis not present

## 2018-10-03 DIAGNOSIS — I499 Cardiac arrhythmia, unspecified: Secondary | ICD-10-CM | POA: Diagnosis not present

## 2018-10-03 DIAGNOSIS — R000 Tachycardia, unspecified: Secondary | ICD-10-CM | POA: Diagnosis not present

## 2018-10-04 ENCOUNTER — Telehealth: Payer: Self-pay | Admitting: Cardiology

## 2018-10-04 NOTE — Telephone Encounter (Signed)
Felt it lying in bed around 10 pm Saturday evening. Pulse very erratic which made him anxious. Not dizzy, not SOB. Took 60 mg diltiazem per on call cardiologist.  Settled down HR.  Slowed but still irregular.  Sun am, BP 104/65, 64, normal feeling heartbeat. Feeling fine since.  In past took long acting dilt regularly - did not need, it was stopped. Then had "pill in pocket plan" with diltiazem.  Never used so was expired  Thinks maybe was little dehydrated and run down from long work week.  Nothing else to trigger.  Spoke with Dr. Meda Coffee who recommends patient continue to use diltiazem 60 mg as needed for afib episode. Discussed plan to call first if BP <110.  Ok to continue exercise as usual, stop if would experience symptoms.  Has f/u scheduled 3/31 with primary cardiologist.  --when I called patient back he stated that Saturday night before speaking with on call dr - he and his wife were nervous and she was driving him to the hospital and then pulled over and called EMS and they ran a rhythm strip showing afib, which he has.  He did not go to the hospital because on call MD called him back.

## 2018-10-04 NOTE — Telephone Encounter (Signed)
New Message   Patient c/o Palpitations:  High priority if patient c/o lightheadedness, shortness of breath, or chest pain  1) How long have you had palpitations/irregular HR/ Afib? Are you having the symptoms now? Saturday evening patient had an episode of afib. It started about 11pm and he went to bed at 2am. When he woke up the next morning his BP and HR returned to normal. No symptoms now.   2) Are you currently experiencing lightheadedness, SOB or CP?  No   3) Do you have a history of afib (atrial fibrillation) or irregular heart rhythm? AFIB experienced it one time before. Yes, one time before   4) Have you checked your BP or HR? (document readings if available): 101/66  5) Are you experiencing any other symptoms? No     Patient states that he spoke with a on call provider and he took dilitazem and it helped. But he is concerned about continuing on the medication.

## 2018-10-06 ENCOUNTER — Other Ambulatory Visit: Payer: Self-pay | Admitting: Cardiology

## 2018-10-08 ENCOUNTER — Encounter: Payer: Self-pay | Admitting: Physician Assistant

## 2018-10-11 ENCOUNTER — Other Ambulatory Visit: Payer: Self-pay

## 2018-10-11 DIAGNOSIS — J302 Other seasonal allergic rhinitis: Secondary | ICD-10-CM | POA: Diagnosis not present

## 2018-10-11 DIAGNOSIS — J029 Acute pharyngitis, unspecified: Secondary | ICD-10-CM | POA: Diagnosis not present

## 2018-10-11 DIAGNOSIS — K120 Recurrent oral aphthae: Secondary | ICD-10-CM | POA: Diagnosis not present

## 2018-10-11 MED ORDER — ROSUVASTATIN CALCIUM 5 MG PO TABS: 5.0000 mg | ORAL_TABLET | ORAL | 0 refills | Status: DC

## 2018-10-12 ENCOUNTER — Telehealth: Payer: Self-pay | Admitting: Physician Assistant

## 2018-10-12 NOTE — Telephone Encounter (Signed)
I spoke with patient about his upcoming appointment with me 10/19/2018 because of the recent coronavirus outbreak.  He is willing to try to do a WebEx appointment.  Can you please send him instructions on how to do this to make sure he has my chart set up?  Thank you

## 2018-10-13 NOTE — Telephone Encounter (Signed)
   Primary Cardiologist:  No primary care provider on file.   Patient contacted.  History reviewed.  No symptoms to suggest any unstable cardiac conditions.  Based on discussion, with current pandemic situation, we will be postponing this appointment for [facility] with a plan for f/u in 12 wks or sooner if feasible/necessary.  If symptoms change, he has been instructed to contact our office.   Routing to C19 CANCEL pool for tracking (P CV DIV CV19 CANCEL - reason for visit "other.") and assigning priority (1 = 4-6 wks, 2 = 6-12 wks, 3 = >12 wks).     Cleon Gustin, RN  10/13/2018 1:24 PM

## 2018-10-13 NOTE — Telephone Encounter (Signed)
Pt spoke with ML, symptoms are controlled. Wants to schedule outpatient visit with labs when available. Pt understands to reach out to [place] if his symptoms change or worsen.

## 2018-10-13 NOTE — Telephone Encounter (Signed)
Called pt to confirm evisit. Pt says he gets 3 free ov per year and was hesitant to use one with a evisit. Pt will be rescheduled for in person visit when available.

## 2018-10-19 ENCOUNTER — Telehealth: Payer: 59 | Admitting: Physician Assistant

## 2018-11-02 ENCOUNTER — Other Ambulatory Visit: Payer: Self-pay | Admitting: Cardiology

## 2018-12-03 ENCOUNTER — Other Ambulatory Visit: Payer: Self-pay | Admitting: Surgery

## 2018-12-03 DIAGNOSIS — R82998 Other abnormal findings in urine: Secondary | ICD-10-CM | POA: Diagnosis not present

## 2018-12-03 DIAGNOSIS — K429 Umbilical hernia without obstruction or gangrene: Secondary | ICD-10-CM | POA: Diagnosis not present

## 2018-12-03 DIAGNOSIS — Z0000 Encounter for general adult medical examination without abnormal findings: Secondary | ICD-10-CM | POA: Diagnosis not present

## 2019-02-26 ENCOUNTER — Other Ambulatory Visit: Payer: Self-pay | Admitting: Cardiology

## 2019-03-18 ENCOUNTER — Other Ambulatory Visit: Payer: Self-pay | Admitting: Cardiology

## 2019-04-01 ENCOUNTER — Other Ambulatory Visit: Payer: Self-pay | Admitting: Cardiology

## 2019-04-05 ENCOUNTER — Other Ambulatory Visit: Payer: Self-pay | Admitting: Cardiology

## 2019-04-05 MED ORDER — ROSUVASTATIN CALCIUM 5 MG PO TABS: 5.0000 mg | ORAL_TABLET | Freq: Every day | ORAL | 1 refills | Status: DC

## 2019-04-05 NOTE — Progress Notes (Signed)
Pt is out of rosuvastatin. I will provide 1 month with 1 refill. He is now aware that he needs appt for follow up before more refills and will call tomorrow.

## 2019-04-12 ENCOUNTER — Encounter: Payer: Self-pay | Admitting: Physician Assistant

## 2019-04-12 NOTE — Progress Notes (Signed)
Cardiology Office Note    Date:  04/14/2019   ID:  Nathan Harrison, DOB 22-Mar-1966, MRN [id number]  PCP:  Shon Baton, MD  Cardiologist:  Ena Dawley, MD  Electrophysiologist:  None   Chief Complaint: f/u atrial fib  History of Present Illness:   Nathan Harrison is a 53 y.o. male with history of PAF, PVCs, NSVT, GERD, former chew tobacco use, hyperlipidemia, anxiety who presents for 1 year follow-up.   ETT in 2014 was normal with above average functional capacity. He was diagnosed with PAF with RVR (HR reported 130 bpm) in 2015 at an urgent care at which time he spontaneously converted to NSR. Since that time he's had rare breakthroughs of palpitations but has not required DCCV. 2D echocardiogram 03/2014 showed EF 55-60%, no RWMA, no significant valvular issues. Coronary CT 08/2016 showed calcium score of zero. Event monitor in 09/2016 showed sinus bradycardia-sinus tach, frequent PVCs/bigeminy (<1%), one 6 beat run NSVT, no atrial fib, average HR 69bpm - medical management recommended.CHADSVASC is zero so he is not on anticoagulation. He previously developed hypotension on standing Cartia so is on PRN diltiazem only.   He returns for follow-up overall feeling well. Back in March he had an episode of breakthrough palpitations. He had run out of his diltiazem so called the on-call service who called in diltiazem. He took a diltiazem and ended up calling EMS, but declined to go to the hospital because he'd begun feeling better. He does not recall what his HR was running at that time but the on-call note indicates he'd said it was 120s at that time. He does not remember what EMS interpreted the EKG as. There is no EMS run sheet from that timeframe in Epic. Since that time he's overall done well without recurrence. He does feel generalized fatigue at times which he isn't sure is normal from working a physical job and running 1.5 miles a day. He has no dyspnea or chest pain. He does not snore. He  admits he doesn't always sleep well or remember to stay hydrated. Last labs 11/2018 albumin 4, normal LFTs, BUN 18, Cr 0.9, Hgb 15.0, Hct 46, LDL 61.   Past Medical History:  Diagnosis Date   Allergy    Anxiety    Chews tobacco    NSVT (nonsustained ventricular tachycardia) ([facility])    PAF (paroxysmal atrial fibrillation) ([facility])    PVC's (premature ventricular contractions)     Past Surgical History:  Procedure Laterality Date   HERNIA REPAIR     WISDOM TOOTH EXTRACTION      Current Medications: Current Meds  Medication Sig   diltiazem (CARDIZEM) 60 MG tablet Take 1 tablet (60 mg total) by mouth as directed. For A Fib   rosuvastatin (CRESTOR) 5 MG tablet Take 1 tablet (5 mg total) by mouth at bedtime.    Allergies:   Tuberculin tests   Social History   Socioeconomic History   Marital status: Married    Spouse name: Not on file   Number of children: Not on file   Years of education: Not on file   Highest education level: Not on file  Occupational History   Not on file  Social Needs   Financial resource strain: Not on file   Food insecurity    Worry: Not on file    Inability: Not on file   Transportation needs    Medical: Not on file    Non-medical: Not on file  Tobacco Use   Smoking status: Never  Smoker   Smokeless tobacco: Former Systems developer    Types: Snuff  Substance and Sexual Activity   Alcohol use: No   Drug use: No   Sexual activity: Yes    Birth control/protection: Condom  Lifestyle   Physical activity    Days per week: Not on file    Minutes per session: Not on file   Stress: Not on file  Relationships   Social connections    Talks on phone: Not on file    Gets together: Not on file    Attends religious service: Not on file    Active member of club or organization: Not on file    Attends meetings of clubs or organizations: Not on file    Relationship status: Not on file  Other Topics Concern   Not on file  Social History  Narrative   Not on file     Family History:  [name] family history includes Arthritis in his brother, father, and mother; Hyperlipidemia in his father. There is no history of Heart attack.  ROS:   Please see the history of present illness.  All other systems are reviewed and otherwise negative.    EKGs/Labs/Other Studies Reviewed:    Studies reviewed were summarized above.   EKG:  EKG is ordered today, personally reviewed, demonstrating sinus bradycardia 53bpm, no acute STT changes  Recent Labs: 04/24/2018: BUN 19; Creatinine, Ser 1.00; Hemoglobin 14.2; Magnesium 2.3; Platelets 152; Potassium 4.0; Sodium 137  Recent Lipid Panel    Component Value Date/Time   CHOL 107 08/24/2017 0944   TRIG 104 08/24/2017 0944   HDL 44 08/24/2017 0944   CHOLHDL 2.4 08/24/2017 0944   LDLCALC 42 08/24/2017 0944    PHYSICAL EXAM:    VS:  BP 116/74    Pulse (!) 53    Ht 5\' 11"  (1.803 m)    Wt 175 lb 12.8 oz (79.7 kg)    SpO2 99%    BMI 24.52 kg/m   BMI: Body mass index is 24.52 kg/m.  GEN: Well nourished, well developed WM, in no acute distress HEENT: normocephalic, atraumatic Neck: no JVD, carotid bruits, or masses Cardiac: RRR; no murmurs, rubs, or gallops, no edema  Respiratory:  clear to auscultation bilaterally, normal work of breathing GI: soft, nontender, nondistended, + BS MS: no deformity or atrophy Skin: warm and dry, no rash Neuro:  Alert and Oriented x 3, Strength and sensation are intact, follows commands Psych: euthymic mood, full affect  Wt Readings from Last 3 Encounters:  04/14/19 175 lb 12.8 oz (79.7 kg)  04/24/18 170 lb (77.1 kg)  08/24/17 175 lb 6.4 oz (79.6 kg)     ASSESSMENT & PLAN:   1. Paroxysmal atrial fibrillation - last episode of palpitations was 09/2018, lasted about 45 minutes. Not sure if this was breakthrough AF or other rhythm. CHADSVASC listed as 1 previously but I am unsure of the tally of 1. He has no hx of CHF, HTN, age>65, diabetes, stroke,  or vascular disease so seems to be zero. He previously did not tolerate standing diltiazem due to some orthostatic-type hypotension. He is not currently having enough symptoms to argue that he should be back on something like this (or low dose metoprolol). We discussed Kardia/AliveCor as way to monitor his breakthrough arrhythmias in the future. He will notify for any recurrences. I will also order an echocardiogram since his last one was 5 years ago given his breakthrough episode earlier this year, to ensure LV function remains  normal. If echo is normal, I did encourage him to f/u with his PCP for his fatigue. He just isn't sure if this is an actual pathological problem or just related to a busy lifestyle. He does not currently have any other risk factors for sleep apnea and does not snore. 2. PVCs - discussed AliveCor as above. I did show him what afib vs PVCs looked like on the computer screen. 3. NSVT - one brief episode in 2018 on event monitor. Continue surveillance for recurrent symptoms. Update echocardiogram. 4. Hyperlipidemia - well controlled on Crestor.  Disposition: F/u with Dr. Meda Coffee in 1 year.  Medication Adjustments/Labs and Tests Ordered: Current medicines are reviewed at length with the patient today.  Concerns regarding medicines are outlined above. Medication changes, Labs and Tests ordered today are summarized above and listed in the Patient Instructions accessible in Encounters.   Signed, Charlie Pitter, PA-C  04/14/2019 2:52 PM    [facility] [address], [place], [place]  [address] Phone: [phone]; Fax: [phone]

## 2019-04-14 ENCOUNTER — Ambulatory Visit (INDEPENDENT_AMBULATORY_CARE_PROVIDER_SITE_OTHER): Payer: 59 | Admitting: Physician Assistant

## 2019-04-14 ENCOUNTER — Encounter: Payer: Self-pay | Admitting: Physician Assistant

## 2019-04-14 ENCOUNTER — Other Ambulatory Visit: Payer: Self-pay

## 2019-04-14 VITALS — BP 116/74 | HR 53 | Ht 71.0 in | Wt 175.8 lb

## 2019-04-14 DIAGNOSIS — I493 Ventricular premature depolarization: Secondary | ICD-10-CM

## 2019-04-14 DIAGNOSIS — E785 Hyperlipidemia, unspecified: Secondary | ICD-10-CM

## 2019-04-14 DIAGNOSIS — I480 Paroxysmal atrial fibrillation: Secondary | ICD-10-CM

## 2019-04-14 DIAGNOSIS — I472 Ventricular tachycardia: Secondary | ICD-10-CM

## 2019-04-14 DIAGNOSIS — I4729 Other ventricular tachycardia: Secondary | ICD-10-CM

## 2019-04-14 MED ORDER — ROSUVASTATIN CALCIUM 5 MG PO TABS: 5.0000 mg | ORAL_TABLET | Freq: Every day | ORAL | 3 refills | Status: DC

## 2019-04-14 NOTE — Patient Instructions (Addendum)
Medication Instructions:  Your physician recommends that you continue on your current medications as directed. Please refer to the Current Medication list given to you today.  If you need a refill on your cardiac medications before your next appointment, please call your pharmacy.   Lab work: None ordered  If you have labs (blood work) drawn today and your tests are completely normal, you will receive your results only by: Marland Kitchen MyChart Message (if you have MyChart) OR . A paper copy in the mail If you have any lab test that is abnormal or we need to change your treatment, we will call you to review the results.  Testing/Procedures: Your physician has requested that you have an echocardiogram. Echocardiography is a painless test that uses sound waves to create images of your heart. It provides your doctor with information about the size and shape of your heart and how well your heart's chambers and valves are working. This procedure takes approximately one hour. There are no restrictions for this procedure.    Follow-Up: At [facility], you and your health needs are our priority.  As part of our continuing mission to provide you with exceptional heart care, we have created designated Provider Care Teams.  These Care Teams include your primary Cardiologist (physician) and Advanced Practice Providers (APPs -  Physician Assistants and Nurse Practitioners) who all work together to provide you with the care you need, when you need it. You will need a follow up appointment in 12 months.  Please call our office 2 months in advance to schedule this appointment.  You may see Ena Dawley, MD or one of the following Advanced Practice Providers on your designated Care Team:   [place], PA-C Melina Copa, PA-C . Ermalinda Barrios, PA-C  Any Other Special Instructions Will Be Listed Below (If Applicable).  Echocardiogram An echocardiogram is a procedure that uses painless sound waves (ultrasound)  to produce an image of the heart. Images from an echocardiogram can provide important information about:  Signs of coronary artery disease (CAD).  Aneurysm detection. An aneurysm is a weak or damaged part of an artery wall that bulges out from the normal force of blood pumping through the body.  Heart size and shape. Changes in the size or shape of the heart can be associated with certain conditions, including heart failure, aneurysm, and CAD.  Heart muscle function.  Heart valve function.  Signs of a past heart attack.  Fluid buildup around the heart.  Thickening of the heart muscle.  A tumor or infectious growth around the heart valves. Tell a health care provider about:  Any allergies you have.  All medicines you are taking, including vitamins, herbs, eye drops, creams, and over-the-counter medicines.  Any blood disorders you have.  Any surgeries you have had.  Any medical conditions you have.  Whether you are pregnant or may be pregnant. What are the risks? Generally, this is a safe procedure. However, problems may occur, including:  Allergic reaction to dye (contrast) that may be used during the procedure. What happens before the procedure? No specific preparation is needed. You may eat and drink normally. What happens during the procedure?   An IV tube may be inserted into one of your veins.  You may receive contrast through this tube. A contrast is an injection that improves the quality of the pictures from your heart.  A gel will be applied to your chest.  A wand-like tool (transducer) will be moved over your chest. The  gel will help to transmit the sound waves from the transducer.  The sound waves will harmlessly bounce off of your heart to allow the heart images to be captured in real-time motion. The images will be recorded on a computer. The procedure may vary among health care providers and hospitals. What happens after the procedure?  You may return  to your normal, everyday life, including diet, activities, and medicines, unless your health care provider tells you not to do that. Summary  An echocardiogram is a procedure that uses painless sound waves (ultrasound) to produce an image of the heart.  Images from an echocardiogram can provide important information about the size and shape of your heart, heart muscle function, heart valve function, and fluid buildup around your heart.  You do not need to do anything to prepare before this procedure. You may eat and drink normally.  After the echocardiogram is completed, you may return to your normal, everyday life, unless your health care provider tells you not to do that. This information is not intended to replace advice given to you by your health care provider. Make sure you discuss any questions you have with your health care provider. Document Released: 07/04/2000 Document Revised: 10/28/2018 Document Reviewed: 08/09/2016 Elsevier Patient Education  2020 [place] might consider looking into the cardiac monitoring device Kardia/AliveCor for your smartphone for any palpitations in the future.[email or web address]

## 2019-04-22 ENCOUNTER — Ambulatory Visit (HOSPITAL_COMMUNITY): Payer: 59 | Attending: Internal Medicine

## 2019-04-22 ENCOUNTER — Other Ambulatory Visit: Payer: Self-pay

## 2019-04-22 DIAGNOSIS — I480 Paroxysmal atrial fibrillation: Secondary | ICD-10-CM

## 2019-04-25 ENCOUNTER — Telehealth: Payer: Self-pay | Admitting: *Deleted

## 2019-04-25 NOTE — Telephone Encounter (Signed)
Pt returned my call and he has been made aware of his echocardiogram results and he verbalized understanding.

## 2019-04-25 NOTE — Telephone Encounter (Signed)
Call placed to pt re: echo results, left a message for pt to call back.

## 2019-04-25 NOTE — Telephone Encounter (Signed)
-----   Message from Charlie Pitter, [place] sent at 04/24/2019  8:14 AM EDT ----- Please let patient know echo overall looked good. LVEF normal. Right heart normal. The IVC (tube going into the heart) appears slightly dilated but right atrium and pulmonary pressures are normal so I do not have any acute concerns about this.  Dayna Dunn PA-C

## 2019-06-03 ENCOUNTER — Telehealth: Payer: Self-pay | Admitting: Cardiology

## 2019-06-03 NOTE — Telephone Encounter (Signed)
I spoke to the patient and answered his questions regarding his surgery and previous Echo results.  He verbalized understanding and was thankful for the call.

## 2019-06-03 NOTE — Telephone Encounter (Signed)
New Message  Patient is having hernia surgery and wants to ask about what pain medications he should be taking after the surgery. Patient also has questions about the echo results and the abnormalities found on it. Please give patient a call back to discuss.

## 2019-08-17 ENCOUNTER — Other Ambulatory Visit: Payer: Self-pay | Admitting: Physician Assistant

## 2019-08-19 ENCOUNTER — Telehealth: Payer: Self-pay | Admitting: Internal Medicine

## 2019-08-19 ENCOUNTER — Telehealth: Payer: Self-pay | Admitting: Cardiology

## 2019-08-19 MED ORDER — ROSUVASTATIN CALCIUM 5 MG PO TABS: 5.0000 mg | ORAL_TABLET | ORAL | 3 refills | Status: DC

## 2019-08-19 NOTE — Telephone Encounter (Signed)
At time of OV in 03/2019 it was reported in med rec that he was taking Crestor daily although now it sounds like that was incorrect. His last labs in 11/2018 demonstrated excellent lipid control with LDL of 61. Therefore it seems feasible to say that whatever regimen he was doing in 11/2018 when he had his lipids checked, this is what I would continue. OK to fix rx from my standpoint. Jacques Fife PA-C

## 2019-08-19 NOTE — Telephone Encounter (Signed)
In that case twice a week is ok

## 2019-08-19 NOTE — Telephone Encounter (Signed)
     Dayna and Janine, this pt was sent in a refill today for Rosuvastatin 5 mg po daily today under Dayna's name.  Appears a CMA ordered this in our refill dept. Our refill dept last refill came from [place], but the refill sent in was signed under Dayna's name.  Pts Pharmacy and the pt states he takes Rosuvastatin 5 mg po twice weekly, on Tuesdays and Fridays.  Last result note from Dr. Meda Coffee indicates this.  Our refill dept would like clarification about this.   Please advise.

## 2019-08-19 NOTE — Telephone Encounter (Signed)
I would do rosuvastatin 5 mg po QHS

## 2019-08-19 NOTE — Telephone Encounter (Signed)
ERROR

## 2019-08-19 NOTE — Telephone Encounter (Signed)
Spoke with the pt and informed him of Dayna's recommendation that being his lipids back on 11/2018 demonstrated excellent lipid control with LDL of 61, it is feasible that he continue his regimen of rosuvastatin 5 mg po twice weekly. Informed the pt that I sent the correct dose of this med to [facility] for him, and this will be available for pick-up today.  Pt verbalized understanding and agrees with this plan.  Pt was gracious for all the assistance provided.

## 2019-08-19 NOTE — Telephone Encounter (Signed)
Pt calling stating that his medication Rosuvastatin was sent in wrong. Pt takes that he only takes this medication on Tuesdays and Fridays and now his pharmacy is confused. Daune Perch, NP, sent this medication into pt's pharmacy. Pt would like a call back concerning this matter. Please address

## 2019-08-19 NOTE — Addendum Note (Signed)
Addended by: Nuala Alpha on: 08/19/2019 03:18 PM   Modules accepted: Orders

## 2019-08-19 NOTE — Telephone Encounter (Addendum)
Dr. Meda Coffee, I called the pt to state you suggested that he take rosuvastatin 5 mg po QHS. Pt is torn with this, for he states last time you advised on his medication you advised him to take Rosuvastatin 5 mg po twice weekly, not daily. Pt states nobody ever called him to tell him this was increased.  On last lipid at our office, you advised for him to take this twice weekly.  He is very anxious that this changed with no explanation of why it was increased. I do not see where Dayna changed this at his last OV back in September.  Please advise what dose he should be on?  If he needs to increase his dose from where you wanted him to take this twice weekly to po daily at bedtime, what is the reason for this.  He will be completely out of this medication after today. Please advise!

## 2020-06-28 ENCOUNTER — Telehealth: Payer: Self-pay | Admitting: Cardiology

## 2020-06-28 NOTE — Telephone Encounter (Signed)
Pt is calling to inform Dr. Meda Coffee that he was prescribed to start taking oral prednisone taper for ongoing back pain/issues, and wants to make sure this medication is safe with his cardiac meds and with his cardiac history, especially PAF.  Informed the pt that I will route this message to both Dr. Meda Coffee and our Pharmacist, for further review and advisement. Informed the pt that I will follow-up with him accordingly thereafter. Pt verbalized understanding and agrees with this plan.

## 2020-06-28 NOTE — Telephone Encounter (Signed)
Pt c/o medication issue:  1. Name of Medication: Prednisone 10 MG   2. How are you currently taking this medication (dosage and times per day)? 2 before breakfast, 1 after lunch, 1 after supper, 2 before bed   3. Are you having a reaction (difficulty breathing--STAT)? No   4. What is your medication issue? Brighten is calling to inform Dr. Meda Coffee he is on this medication and is requesting a callback to discuss it. Please advise.

## 2020-06-28 NOTE — Telephone Encounter (Signed)
Notified the pt that per our Pharmacist, it is ok for him to take prednisone for a short course, and this could potentially increase his HR/BP, but ok to take this short term. Pt states he is only on prednisone short term, for 12 days and tapered. Pt verbalized understanding and agrees with this plan.  Pt appreciative for all the assistance provided.

## 2020-06-28 NOTE — Telephone Encounter (Signed)
Ok to take prednisone for a short course. Could potentially increase his heart-rate or blood pressure, but short term its ok.

## 2020-08-22 ENCOUNTER — Other Ambulatory Visit: Payer: Self-pay | Admitting: Physician Assistant

## 2020-09-01 ENCOUNTER — Other Ambulatory Visit: Payer: Self-pay | Admitting: Cardiology

## 2020-10-12 ENCOUNTER — Telehealth: Payer: Self-pay | Admitting: Cardiology

## 2020-10-12 ENCOUNTER — Other Ambulatory Visit: Payer: Self-pay | Admitting: Cardiology

## 2020-10-12 MED ORDER — ROSUVASTATIN CALCIUM 5 MG PO TABS: 5.0000 mg | ORAL_TABLET | ORAL | 0 refills | Status: DC

## 2020-10-12 NOTE — Addendum Note (Signed)
Addended by: Carter Kitten D on: 10/12/2020 11:13 AM   Modules accepted: Orders

## 2020-10-12 NOTE — Addendum Note (Signed)
Addended by: Carter Kitten D on: 10/12/2020 11:15 AM   Modules accepted: Orders

## 2020-10-12 NOTE — Telephone Encounter (Signed)
Pt's medication was sent to pt's pharmacy as requested. Confirmation received.  °

## 2020-10-12 NOTE — Telephone Encounter (Signed)
*  STAT* If patient is at the pharmacy, call can be transferred to refill team.   1. Which medications need to be refilled? (please list name of each medication and dose if known) rosuvastatin (CRESTOR) 5 MG tablet  2. Which pharmacy/location (including street and city if local pharmacy) is medication to be sent to? [place], [address] [place]  3. Do they need a 30 day or 90 day supply? 30 day   Patient is out of medication. He is scheduled 11/01/2020

## 2020-10-14 ENCOUNTER — Other Ambulatory Visit: Payer: Self-pay | Admitting: Cardiology

## 2020-10-26 NOTE — Progress Notes (Signed)
Cardiology Office Note:    Date:  11/01/2020   ID:  Nathan Harrison, DOB 01-27-1966, MRN [id number]  PCP:  Shon Baton, [place]  Cardiologist:  Ena Dawley, MD  Advanced Practice Provider:  No care team member to display Electrophysiologist:  None  :[id number]}   Referring MD: Shon Baton, MD    History of Present Illness:    Nathan Harrison is a 55 y.o. male with a hx of PAF, PVCs, NSVT, GERD, former chew tobacco use, hyperlipidemia, and anxiety who was previously followed by Dr. Meda Coffee who now presents to clinic for follow-up.   ETT in 2014 was normal with above average functional capacity. He was diagnosed with PAF with RVR (HR reported 130 bpm) in 2015 at an urgent care at which time he spontaneously converted to NSR. Since that time he's had rare breakthroughs of palpitations but has not required DCCV. 2D echocardiogram 03/2014 showed EF 55-60%, no RWMA, no significant valvular issues. Coronary CT 08/2016 showed calcium score of zero. Event monitor in 09/2016 showed sinus bradycardia-sinus tach, frequent PVCs/bigeminy (<1%), one 6 beat run NSVT, no atrial fib, average HR 69bpm - medical management recommended.CHADSVASC is zero so he is not on anticoagulation. He previously developed hypotension on standing cardizem so is on PRN diltiazem only.   Was last seen by Melina Copa on 04/14/19 where he was doing well with last episode of pAfib in 09/2018.   Today, the patient feels well. No known episodes of Afib since 09/2018 and no palpitations. No chest pain or DOE. Patient is very active and runs 108mile/5x day week and lifts 3days/week. Continues to maintain a healthy diet. No lightheadedness and dizziness. No orthopnea or PND.   Past Medical History:  Diagnosis Date  . Allergy   . Anxiety   . Chews tobacco   . NSVT (nonsustained ventricular tachycardia) ([place])   . PAF (paroxysmal atrial fibrillation) ([place])   . PVC's (premature ventricular contractions)      Past Surgical History:  Procedure Laterality Date  . HERNIA REPAIR    . WISDOM TOOTH EXTRACTION      Current Medications: Current Meds  Medication Sig  . diltiazem (CARDIZEM) 60 MG tablet Take 1 tablet (60 mg total) by mouth as directed. For A Fib  . rosuvastatin (CRESTOR) 5 MG tablet Take 1 tablet (5 mg total) by mouth 2 (two) times a week. Please keep upcoming appt in April 2022 before anymore refills. Thank you     Allergies:   Soy allergy and Tuberculin tests   Social History   Socioeconomic History  . Marital status: Married    Spouse name: Not on file  . Number of children: Not on file  . Years of education: Not on file  . Highest education level: Not on file  Occupational History  . Not on file  Tobacco Use  . Smoking status: Never Smoker  . Smokeless tobacco: Former Systems developer    Types: Snuff  Vaping Use  . Vaping Use: Never used  Substance and Sexual Activity  . Alcohol use: No  . Drug use: No  . Sexual activity: Yes    Birth control/protection: Condom  Other Topics Concern  . Not on file  Social History Narrative  . Not on file   Social Determinants of Health   Financial Resource Strain: Not on file  Food Insecurity: Not on file  Transportation Needs: Not on file  Physical Activity: Not on file  Stress: Not on file  Social Connections: Not on file     Family History: [name] family history includes Arthritis in his brother, father, and mother; Hyperlipidemia in his father. There is no history of Heart attack.  ROS:   Please see the history of present illness.    Review of Systems  Constitutional: Negative for chills and fever.  HENT: Negative for hearing loss.   Eyes: Negative for blurred vision and redness.  Respiratory: Negative for shortness of breath.   Cardiovascular: Negative for chest pain, palpitations, orthopnea, claudication, leg swelling and PND.  Gastrointestinal: Negative for melena, nausea and vomiting.  Genitourinary:  Negative for flank pain.  Musculoskeletal: Negative for falls.  Neurological: Negative for dizziness and loss of consciousness.  Endo/Heme/Allergies: Negative for polydipsia.  Psychiatric/Behavioral: Negative for substance abuse.    EKGs/Labs/Other Studies Reviewed:    The following studies were reviewed today: TTE 05-13-2019: 1. Left ventricular ejection fraction, by visual estimation, is 50 to  55%. The left ventricle has normal function. Normal left ventricular size.  There is no left ventricular hypertrophy.  2. Global right ventricle has normal systolic function.The right  ventricular size is normal. No increase in right ventricular wall  thickness.  3. Left atrial size was normal.  4. Right atrial size was normal.  5. The inferior vena cava is dilated in size with >50% respiratory  variability, suggesting right atrial pressure of 8 mmHg.   FINDINGS  Left Ventricle: Left ventricular ejection fraction, by visual estimation,  is 50 to 55%. The left ventricle has normal function. There is no left  ventricular hypertrophy. Normal left ventricular size.   Right Ventricle: The right ventricular size is normal. No increase in  right ventricular wall thickness. Global RV systolic function is has  normal systolic function. The tricuspid regurgitant velocity is 2.14 m/s,  and with an assumed right atrial pressure  of 10 mmHg, the estimated right ventricular systolic pressure is normal  at 28.3 mmHg.   Left Atrium: Left atrial size was normal in size.   Right Atrium: Right atrial size was normal in size   Pericardium: There is no evidence of pericardial effusion.   Mitral Valve: The mitral valve is normal in structure. Trace mitral valve  regurgitation.   Tricuspid Valve: The tricuspid valve is normal in structure. Tricuspid  valve regurgitation is trivial by color flow Doppler.   Aortic Valve: The aortic valve is tricuspid. Aortic valve regurgitation  was not visualized  by color flow Doppler. Mild aortic valve sclerosis is  present, with no evidence of aortic valve stenosis.   Pulmonic Valve: The pulmonic valve was normal in structure. Pulmonic valve  regurgitation is not visualized by color flow Doppler.   Aorta: The aortic root is normal in size and structure.   Venous: The inferior vena cava is dilated in size with greater than 50%  respiratory variability, suggesting right atrial pressure of 8 mmHg.   IAS/Shunts: No atrial level shunt detected by color flow Doppler.   Cardiac Monitor 2018:  Sinus bradycardia to sinus tachycardia.  Frequent PVCs and bigeminy.  One run of nsVT that was asymptomatic.  No atrial fibrillation.   Continue current medica management.  Coronary CTA 08/2016: FINDINGS: A 120 kV prospective scan was triggered in the descending thoracic aorta at 111 HU's. Axial non-contrast 3 mm slices were carried out through the heart. The data set was analyzed on a dedicated work station and scored using the [place]. Gantry rotation speed was 270 msecs and collimation was .9  mm. 5 mg of iv Metoprolol and 0.8 mg of sl NTG was given. The 3D data set was reconstructed in 5% intervals of the 67-82 % of the R-R cycle. Diastolic phases were analyzed on a dedicated work station using MPR, MIP and VRT modes. The patient received 80 cc of contrast.  Aorta:  Normal size.  No calcifications.  No dissection.  Aortic Valve:  Trileaflet.  No calcifications.  Coronary Arteries: Normal coronary origin. Right dominance. This study is affected by significant motion in the proximal RCA and LAD arteries. There is no plaque in the visualized portions of those vessels.  RCA is a large dominant artery that gives rise to PDA and PLVB.  LM has no plaque.  LCX is a non-dominant artery that gives rise to one large OM1 branch. There is no plaque.  Other findings:  Normal pulmonary vein drainage into the left atrium.  Normal let  atrial appendage without a thrombus.  Normal size of the pulmonary artery.  No ASD/VSD.  IMPRESSION: 1. Coronary calcium score of 0. This was 0 percentile for age and sex matched control.  2. Normal coronary origin with right dominance. This study is affected by significant motion in the proximal RCA and LAD arteries. There is no plaque in the visualized portions of those vessels. No plaque in LCx artery.  3. Most probably normal study, however significant motion artifact. Because of that and the fact that the patient is having nsVTs on event monitor a cardiac stress MRI is recommended for further evaluation.  EKG:  EKG is  ordered today.  The ekg ordered today demonstrates sinus bradycardia with HR 50.  Recent Labs: No results found for requested labs within last 8760 hours.  Recent Lipid Panel    Component Value Date/Time   CHOL 107 08/24/2017 0944   TRIG 104 08/24/2017 0944   HDL 44 08/24/2017 0944   CHOLHDL 2.4 08/24/2017 0944   LDLCALC 42 08/24/2017 0944     Risk Assessment/Calculations:    CHA2DS2-VASc Score = 0  {This indicates a 0.2% annual risk of stroke. [name] score is based upon: CHF History: No HTN History: No Diabetes History: No Stroke History: No Vascular Disease History: No Age Score: 0 Gender Score: 0       Physical Exam:    VS:  BP 118/80   Pulse 60   Ht 5\' 11"  (1.803 m)   Wt 174 lb 6.4 oz (79.1 kg)   SpO2 99%   BMI 24.32 kg/m     Wt Readings from Last 3 Encounters:  11/01/20 174 lb 6.4 oz (79.1 kg)  04/14/19 175 lb 12.8 oz (79.7 kg)  04/24/18 170 lb (77.1 kg)     GEN:  Well nourished, well developed in no acute distress HEENT: Normal NECK: No JVD; No carotid bruits CARDIAC: RRR, no murmurs, rubs, gallops RESPIRATORY:  Clear to auscultation without rales, wheezing or rhonchi  ABDOMEN: Soft, non-tender, non-distended MUSCULOSKELETAL:  No edema; No deformity  SKIN: Warm and dry NEUROLOGIC:  Alert and oriented x  3 PSYCHIATRIC:  Normal affect   ASSESSMENT:    1. PAF (paroxysmal atrial fibrillation) ([place])   2. PVC's (premature ventricular contractions)   3. NSVT (nonsustained ventricular tachycardia) ([place])   4. Hyperlipidemia, unspecified hyperlipidemia type    PLAN:    In order of problems listed above:  #Paroxysmal Afib:  CHADs-vasc 0. Not on AC. No recurrence of symptoms since 09/2018. -Continue dilt 60mg  a needed -Not on AC due to CHADs-vasc  0  #PVCs: #NSVT: Asymptomatic with no palpitations. TTE with normal LVEF 50-55%, no significant valve abnormalities. -Continue to monitor  #HLD: LDL controlled at 70. Ca score 0 on CT in 2018. -Crestor 5mg  daily    Medication Adjustments/Labs and Tests Ordered: Current medicines are reviewed at length with the patient today.  Concerns regarding medicines are outlined above.  No orders of the defined types were placed in this encounter.  No orders of the defined types were placed in this encounter.   Patient Instructions  Medication Instructions:  NO CHANGESIf you need a refill on your cardiac medications before your next appointment, please call your pharmacy*   Lab Work: NONE If you have labs (blood work) drawn today and your tests are completely normal, you will receive your results only by: Marland Kitchen MyChart Message (if you have MyChart) OR . A paper copy in the mail If you have any lab test that is abnormal or we need to change your treatment, we will call you to review the results.   Testing/Procedures: NONE   Follow-Up: At [facility], you and your health needs are our priority.  As part of our continuing mission to provide you with exceptional heart care, we have created designated Provider Care Teams.  These Care Teams include your primary Cardiologist (physician) and Advanced Practice Providers (APPs -  Physician Assistants and Nurse Practitioners) who all work together to provide you with the care you need, when you need  it.  We recommend signing up for the patient portal called "MyChart".  Sign up information is provided on this After Visit Summary.  MyChart is used to connect with patients for Virtual Visits (Telemedicine).  Patients are able to view lab/test results, encounter notes, upcoming appointments, etc.  Non-urgent messages can be sent to your provider as well.   To learn more about what you can do with MyChart, go to [email or web address].    Your next appointment:   12 month(s)  The format for your next appointment:   In Person  Provider:   Gwyndolyn Kaufman, MD   Other Instructions NONE     Signed, Freada Bergeron, MD  11/01/2020 10:42 AM    [place]

## 2020-11-01 ENCOUNTER — Other Ambulatory Visit: Payer: Self-pay

## 2020-11-01 ENCOUNTER — Encounter: Payer: Self-pay | Admitting: Cardiology

## 2020-11-01 ENCOUNTER — Ambulatory Visit: Payer: 59 | Admitting: Cardiology

## 2020-11-01 VITALS — BP 118/80 | HR 60 | Ht 71.0 in | Wt 174.4 lb

## 2020-11-01 DIAGNOSIS — I472 Ventricular tachycardia: Secondary | ICD-10-CM

## 2020-11-01 DIAGNOSIS — I493 Ventricular premature depolarization: Secondary | ICD-10-CM

## 2020-11-01 DIAGNOSIS — I480 Paroxysmal atrial fibrillation: Secondary | ICD-10-CM

## 2020-11-01 DIAGNOSIS — I4729 Other ventricular tachycardia: Secondary | ICD-10-CM

## 2020-11-01 DIAGNOSIS — E785 Hyperlipidemia, unspecified: Secondary | ICD-10-CM | POA: Diagnosis not present

## 2020-11-01 NOTE — Patient Instructions (Signed)
Medication Instructions:  NO CHANGESIf you need a refill on your cardiac medications before your next appointment, please call your pharmacy*   Lab Work: NONE If you have labs (blood work) drawn today and your tests are completely normal, you will receive your results only by: Marland Kitchen MyChart Message (if you have MyChart) OR . A paper copy in the mail If you have any lab test that is abnormal or we need to change your treatment, we will call you to review the results.   Testing/Procedures: NONE   Follow-Up: At [facility], you and your health needs are our priority.  As part of our continuing mission to provide you with exceptional heart care, we have created designated Provider Care Teams.  These Care Teams include your primary Cardiologist (physician) and Advanced Practice Providers (APPs -  Physician Assistants and Nurse Practitioners) who all work together to provide you with the care you need, when you need it.  We recommend signing up for the patient portal called "MyChart".  Sign up information is provided on this After Visit Summary.  MyChart is used to connect with patients for Virtual Visits (Telemedicine).  Patients are able to view lab/test results, encounter notes, upcoming appointments, etc.  Non-urgent messages can be sent to your provider as well.   To learn more about what you can do with MyChart, go to [email or web address].    Your next appointment:   12 month(s)  The format for your next appointment:   In Person  Provider:   Gwyndolyn Kaufman, MD   Other Instructions NONE

## 2020-11-06 ENCOUNTER — Other Ambulatory Visit: Payer: Self-pay | Admitting: Cardiology

## 2021-06-25 ENCOUNTER — Telehealth: Payer: Self-pay | Admitting: Cardiology

## 2021-06-25 MED ORDER — DILTIAZEM HCL 60 MG PO TABS: 60.0000 mg | ORAL_TABLET | ORAL | 6 refills | Status: DC

## 2021-06-25 NOTE — Telephone Encounter (Signed)
Jerrion, [place] - 06/25/2021  8:11 AM Loel Dubonnet, NP  Sent: Tue June 25, 2021  2:14 PM  To: Freada Bergeron, MD; Nuala Alpha, LPN          Message  Got it. Thank you!

## 2021-06-25 NOTE — Telephone Encounter (Signed)
Patient c/o Palpitations:  High priority if patient c/o lightheadedness, shortness of breath, or chest pain  How long have you had palpitations/irregular HR/ Afib? Are you having the symptoms now? Afib episode last night- talked to provider on call- told him to call today and make an appointment- not in Afib at this time  Are you currently experiencing lightheadedness, SOB or CP? no  Do you have a history of afib (atrial fibrillation) or irregular heart rhythm? History of AFIB  Have you checked your BP or HR? (document readings if available): good  e you experiencing any other symptoms? No, patient was gtold to be seen- pattient wants to be seen sooner than Thursday(06-27-21).  I made him an appointment for Thursday with Urban Gibson- Please call to evaluate

## 2021-06-25 NOTE — Telephone Encounter (Signed)
Jarett, Dralle - 06/25/2021  8:11 AM Freada Bergeron, MD  Sent: Tue June 25, 2021 11:12 AM  To: Nuala Alpha, LPN; Loel Dubonnet, NP          Message  Sounds like a plan to me. Probably just needs a monitor when he sees [place].   Pt aware of Dr. Jacolyn Reedy recommendations. Pts follow-up appt is moved up now with Laurann Montana NP for tomorrow 12/7. Pt verbalized understanding and agrees with this plan.

## 2021-06-25 NOTE — Telephone Encounter (Signed)
Pt called into the office to inform Dr. Johney Frame that at 11:30 pm last night, he felt slight palpitations and checked his pulse and noted it was irregular.  Pt was concerned he was having another afib episode.  Pt states he's had only 3 episodes of afib since seeing [place] in Cardiology.  Pt states he was concerned about episode that occurred last night, so he called our on-call for further advisement on this.  On-call advised the pt to take his pill-in-a-pocket Diltiazem 60 mg and call the office in the morning to obtain a visit in the next week or so.  Pt took his diltiazem 60 mg at around midnight last night, after speaking with on-call.  Pt said on-call advised him he could safely take this if systolic BP was 122 or greater.  Pt states his BP before taking dilt was 118/80 and HR-90 but irregular.  Pt states the only cardiac complaint he feels when this occurred was fluttering, but denies any chest pain, sob, doe, dizziness, pre-syncopal or syncopal episodes.   Pt states he's an avid exerciser and did exert himself hard yesterday and reports he didn't drink enough water.  He states that may be associated with why his episode occurred last night.  Pt did mention his diltiazem script is expired from 2020, and needs updated refill sent to his confirmed pharmacy.  Will send in refill for the pt.  Pt is scheduled to see Laurann Montana NP for this Thursday 12/8, for complaints.  Advised the pt to continue using his pill-in-a-pocket as directed and monitor his BP prior to taking this, with a systolic of 449 or greater before administering.  Advised him to limit his caffeine intake and salt intake.  Advised him to stay plenty hydrated and avoid over-exerting until he is seen by [facility] on 12/8. Informed the pt that I will route this message to Dr. Johney Frame to make her aware of this plan.  Informed the pt if she has any further recommendations, I will call him shortly thereafter.  Pt verbalized understanding and  agrees with this plan.

## 2021-06-26 ENCOUNTER — Other Ambulatory Visit: Payer: Self-pay

## 2021-06-26 ENCOUNTER — Encounter (HOSPITAL_BASED_OUTPATIENT_CLINIC_OR_DEPARTMENT_OTHER): Payer: Self-pay | Admitting: Nurse Practitioner

## 2021-06-26 ENCOUNTER — Ambulatory Visit (HOSPITAL_BASED_OUTPATIENT_CLINIC_OR_DEPARTMENT_OTHER): Payer: BC Managed Care – PPO | Admitting: Nurse Practitioner

## 2021-06-26 VITALS — BP 118/70 | HR 62 | Ht 72.0 in | Wt 176.2 lb

## 2021-06-26 DIAGNOSIS — I480 Paroxysmal atrial fibrillation: Secondary | ICD-10-CM

## 2021-06-26 NOTE — Patient Instructions (Signed)
Medication Instructions:  Your physician recommends that you continue on your current medications as directed. Please refer to the Current Medication list given to you today.  *If you need a refill on your cardiac medications before your next appointment, please call your pharmacy*   Testing/Procedures: Your physician has recommended that you wear a 30 day cardiac event monitor. Event monitors are medical devices that record the heart's electrical activity. Doctors most often [place] these monitors to diagnose arrhythmias. Arrhythmias are problems with the speed or rhythm of the heartbeat. The monitor is a small, portable device. You can wear one while you do your normal daily activities. This is usually used to diagnose what is causing palpitations/syncope (passing out).  This will be mailed to you with instructions.    Follow-Up: At [facility], you and your health needs are our priority.  As part of our continuing mission to provide you with exceptional heart care, we have created designated Provider Care Teams.  These Care Teams include your primary Cardiologist (physician) and Advanced Practice Providers (APPs -  Physician Assistants and Nurse Practitioners) who all work together to provide you with the care you need, when you need it.  We recommend signing up for the patient portal called "MyChart".  Sign up information is provided on this After Visit Summary.  MyChart is used to connect with patients for Virtual Visits (Telemedicine).  Patients are able to view lab/test results, encounter notes, upcoming appointments, etc.  Non-urgent messages can be sent to your provider as well.   To learn more about what you can do with MyChart, go to [email or web address].    Your next appointment:   2 month(s)  The format for your next appointment:   In Person  Provider:   Dr. Johney Frame or APP

## 2021-06-26 NOTE — Progress Notes (Signed)
Office Visit    Patient Name: Nathan Harrison Date of Encounter: 06/26/2021  PCP:  Shon Baton, MD   [place]  Cardiologist:  Freada Bergeron, MD  Advanced Practice Provider:  No care team member to display Electrophysiologist:  None      Chief Complaint    Nathan Harrison is a 55 y.o. male with a hx of paroxysmal atrial fibrillation, PVC, NSVT, GERD, former to tobacco use, hyperlipidemia, anxiety presents today for follow up of atrial fibrillation   Past Medical History    Past Medical History:  Diagnosis Date   Allergy    Anxiety    Chews tobacco    NSVT (nonsustained ventricular tachycardia)    PAF (paroxysmal atrial fibrillation) (HCC)    PVC's (premature ventricular contractions)    Past Surgical History:  Procedure Laterality Date   HERNIA REPAIR     WISDOM TOOTH EXTRACTION      Allergies  Allergies  Allergen Reactions   Soy Allergy    Tuberculin Tests Other (See Comments)    Had adverse reaction in college    History of Present Illness    Nathan Harrison is a 55 y.o. male with a hx of paroxysmal atrial fibrillation, PVC, NSVT, GERD, former to tobacco use, hyperlipidemia, anxiety  last seen 11/01/20.  His cardiac test includes EGD in 2014 which is normal as above average functional capacity.  He was diagnosed with paroxysmal atrial fibrillation with RVR in 2015 at urgent care at which time he spontaneously converted to normal sinus rhythm.  Since that time he has had rare breakthrough episodes of palpitations but has not required cardioversion.  Echocardiogram 03/2014 LVEF 55 to 60%, no R WMA, no significant valvular abnormality.  Coronary CTA 08/2016 with calcium score of 0.  Event monitor March 2018 with predominantly sinus rhythm with PVCs/bigeminy (<1%), 160 run of SVT, no atrial fibrillation, average heart rate 69 bpm.  His CHA2DS2-VASc is 0 and he has not been maintained on anticoagulation.  He previously developed hypotension  on standing diltiazem and is on as needed diltiazem only.  He was last seen in clinic 11/01/2020 by Dr. Johney Frame.  At that time he had had no episodes of atrial fibrillation since 09/2018 and no palpitations.  He was very active running and lifting for exercise.   He contacted the office yesterday after palpitations the night prior with irregular heartbeat.  He reported this was the first episode of a fib in a long while. He did note that he had been very active and likely not had enough water.  His pill in a pocket for diltiazem was continued.  Today, he is here alone for follow-up. He had one episode of atrial fib that began at 11:30 pm  2 nights ago when he was getting ready to go to sleep and felt chest fluttering "like a bird in my chest". He took diltiazem 60 mg x 1 and a fib resolved around 6 am the following morning (yesterday). He reports a total of 3-4 episodes of a fib in the past 7 years. Has brief episodes of palpitations that occur 1-2 times per month. States he knows when he is in a fib and these palpitations that occur monthly are different. Currently, he denies symptoms of fluttering or chest discomfort.   EKGs/Labs/Other Studies Reviewed:   The following studies were reviewed today:  TTE 04/22/19:  1. Left ventricular ejection fraction, by visual estimation, is 50 to  55%. The left ventricle has  normal function. Normal left ventricular size.  There is no left ventricular hypertrophy.   2. Global right ventricle has normal systolic function.The right  ventricular size is normal. No increase in right ventricular wall  thickness.   3. Left atrial size was normal.   4. Right atrial size was normal.   5. The inferior vena cava is dilated in size with >50% respiratory  variability, suggesting right atrial pressure of 8 mmHg.    Cardiac Monitor 2018: Sinus bradycardia to sinus tachycardia. Frequent PVCs and bigeminy. One run of nsVT that was asymptomatic. No atrial  fibrillation.   Continue current medica management.   Coronary CTA 08/2016: FINDINGS: A 120 kV prospective scan was triggered in the descending thoracic aorta at 111 HU's. Axial non-contrast 3 mm slices were carried out through the heart. The data set was analyzed on a dedicated work station and scored using the [place]. Gantry rotation speed was 270 msecs and collimation was .9 mm. 5 mg of iv Metoprolol and 0.8 mg of sl NTG was given. The 3D data set was reconstructed in 5% intervals of the 67-82 % of the R-R cycle. Diastolic phases were analyzed on a dedicated work station using MPR, MIP and VRT modes. The patient received 80 cc of contrast.   Aorta:  Normal size.  No calcifications.  No dissection.   Aortic Valve:  Trileaflet.  No calcifications.   Coronary Arteries: Normal coronary origin. Right dominance. This study is affected by significant motion in the proximal RCA and LAD arteries. There is no plaque in the visualized portions of those vessels.   RCA is a large dominant artery that gives rise to PDA and PLVB.   LM has no plaque.   LCX is a non-dominant artery that gives rise to one large OM1 branch. There is no plaque.   Other findings:   Normal pulmonary vein drainage into the left atrium.   Normal let atrial appendage without a thrombus.   Normal size of the pulmonary artery.   No ASD/VSD.   IMPRESSION: 1. Coronary calcium score of 0. This was 0 percentile for age and sex matched control.   2. Normal coronary origin with right dominance. This study is affected by significant motion in the proximal RCA and LAD arteries. There is no plaque in the visualized portions of those vessels. No plaque in LCx artery.   3. Most probably normal study, however significant motion artifact. Because of that and the fact that the patient is having nsVTs on event monitor a cardiac stress MRI is recommended for further evaluation.  EKG:  EKG is ordered today.   The ekg ordered today demonstrates NSR at rate of 62 bpm, no ST/T abnormalities  Recent Labs: No results found for requested labs within last 8760 hours.  Recent Lipid Panel    Component Value Date/Time   CHOL 107 08/24/2017 0944   TRIG 104 08/24/2017 0944   HDL 44 08/24/2017 0944   CHOLHDL 2.4 08/24/2017 0944   LDLCALC 42 08/24/2017 0944    Risk Assessment/Calculations:   CHA2DS2-VASc Score = 0   This indicates a 0.2% annual risk of stroke. [name] score is based upon: CHF History: 0 HTN History: 0 Diabetes History: 0 Stroke History: 0 Vascular Disease History: 0 Age Score: 0 Gender Score: 0      Home Medications   Current Meds  Medication Sig   diltiazem (CARDIZEM) 60 MG tablet Take 1 tablet (60 mg total) by mouth as directed. For A  Fib     Review of Systems      All other systems reviewed and are otherwise negative except as noted above.  Physical Exam    VS:  BP 118/70   Pulse 62   Ht 6' (1.829 m)   Wt 176 lb 3.2 oz (79.9 kg)   BMI 23.90 kg/m  , BMI Body mass index is 23.9 kg/m.  Wt Readings from Last 3 Encounters:  06/26/21 176 lb 3.2 oz (79.9 kg)  11/01/20 174 lb 6.4 oz (79.1 kg)  04/14/19 175 lb 12.8 oz (79.7 kg)     GEN: Well nourished, well developed, in no acute distress. HEENT: normal. Neck: Supple, no JVD, carotid bruits, or masses. Cardiac: RRR, no murmurs, rubs, or gallops. No clubbing, cyanosis, edema.  Radials/PT 2+ and equal bilaterally.  Respiratory:  Respirations regular and unlabored, clear to auscultation bilaterally. GI: Soft, nontender, nondistended. MS: No deformity or atrophy. Skin: Warm and dry, no rash. Neuro:  Strength and sensation are intact. Psych: Normal affect.  Assessment & Plan    PAF - He reports rare episodes of atrial fibrillation, with most recent occurring 2 days ago. He converted to sinus rhythm after approximately 6-7 hours and with diltiazem 60 mg. He thinks he was dehydrated after working all day  and then lifting weights and jumping rope. He denies SOB, dizziness, lightheadedness associated with fluttering. He avoids alcohol and caffeine. Encouraged good hydration of 64 oz water daily. Encouraged continue exercise unless he develops symptoms of dizziness, lightheadedness, presyncope, syncope, SOB, or chest discomfort. Consideration of changing him to beta blocker was given, however he has rare symptoms and HR in clinic is 62 bpm so will continue prn diltiazem for now. We discussed BP monitoring with diltiazem - systolic should be > 881 mmHg. Advised he may also try 1/2 pill diltiazem (30 mg) if atrial fib persists and BP is < 110 mmHg or he feels lightheaded. Will order 30 day monitor to observe frequency of arrhythmia. Continue diltiazem. Have requested updated lab results from PCP. Would consider rechecking bmet, tsh, magnesium at next ov if not received from PCP.   PVC / NSVT -  He has palpitations 1-2 times per month that feel different from a fib that he thinks are PVCs. He reports these episodes are very brief and self-limiting. He does not take any medication for these. 30 day monitor ordered to help [place] differentiate between PVCs/NSVT and atrial fibrillation.   HLD - Coronary CT 2018 with calcium score of 0. Reports lab work done annually by PCP. We do not have recent results, will contact PCP to request labs be faxed to [place]. He adheres to heart healthy, low cholesterol diet, and exercises 5 days per week. Continue rosuvastatin.   Disposition: Follow up in 2 month(s) with Freada Bergeron, MD or APP.  Signed, Emmaline Life, NP 06/26/2021, 8:45 AM [facility]

## 2021-06-27 ENCOUNTER — Ambulatory Visit (HOSPITAL_BASED_OUTPATIENT_CLINIC_OR_DEPARTMENT_OTHER): Payer: 59 | Admitting: Family

## 2021-10-08 NOTE — Progress Notes (Signed)
?Cardiology Office Note:   ? ?Date:  10/11/2021  ? ?ID:  Nathan Harrison, DOB 1965-11-12, MRN [id number] ? ?PCP:  Shon Baton, MD ?  ?[place]  ?Cardiologist:  Freada Bergeron, MD  ?Advanced Practice Provider:  No care team member to display ?Electrophysiologist:  None  :[id number]}  ? ?Referring MD: Shon Baton, MD  ? ? ?History of Present Illness:   ? ?Nathan Harrison is a 56 y.o. male with a hx of PAF, PVCs, NSVT, GERD, former chew tobacco use, hyperlipidemia, and anxiety who was previously followed by Dr. Meda Coffee who now presents to clinic for follow-up.  ? ?Per review of the record, the patient underwent ETT in 2014 was normal with above average functional capacity. He was diagnosed with PAF with RVR (HR reported 130 bpm) in 2015 at an urgent care at which time he spontaneously converted to NSR. Since that time he's had rare breakthroughs of palpitations but has not required DCCV. 2D echocardiogram 03/2014 showed EF 55-60%, no RWMA, no significant valvular issues. Coronary CT 08/2016 showed calcium score of zero. Event monitor in 09/2016 showed sinus bradycardia-sinus tach, frequent PVCs/bigeminy (<1%), one 6 beat run NSVT, no atrial fib, average HR 69bpm - medical management recommended.CHADSVASC is zero so he is not on anticoagulation. He previously developed hypotension on standing cardizem so is on PRN diltiazem only.  ? ?Was last seen in clinic 10/2020 where he was doing well. Remained very active. ? ?Today, the patient overall feels well. No chest pain, SOB, nausea, vomiting, or palpitations. Blood pressure is well controlled off medication. On crestor '5mg'$  2x/week. Current on mainly plant based diet with occasional meat. Remains very active with no anginal symptoms. ? ?Past Medical History:  ?Diagnosis Date  ? Allergy   ? Anxiety   ? Chews tobacco   ? NSVT (nonsustained ventricular tachycardia)   ? PAF (paroxysmal atrial fibrillation) ([place])   ? PVC's (premature ventricular  contractions)   ? ? ?Past Surgical History:  ?Procedure Laterality Date  ? HERNIA REPAIR    ? WISDOM TOOTH EXTRACTION    ? ? ?Current Medications: ?Current Meds  ?Medication Sig  ? diltiazem (CARDIZEM) 60 MG tablet Take 1 tablet (60 mg total) by mouth as directed. For A Fib  ? [DISCONTINUED] rosuvastatin (CRESTOR) 5 MG tablet Take 1 tablet (5 mg total) by mouth 2 (two) times a week.  ?  ? ?Allergies:   Soy allergy and Tuberculin tests  ? ?Social History  ? ?Socioeconomic History  ? Marital status: Married  ?  Spouse name: Not on file  ? Number of children: Not on file  ? Years of education: Not on file  ? Highest education level: Not on file  ?Occupational History  ? Not on file  ?Tobacco Use  ? Smoking status: Never  ? Smokeless tobacco: Former  ?  Types: Snuff  ?Vaping Use  ? Vaping Use: Never used  ?Substance and Sexual Activity  ? Alcohol use: No  ? Drug use: No  ? Sexual activity: Yes  ?  Birth control/protection: Condom  ?Other Topics Concern  ? Not on file  ?Social History Narrative  ? Not on file  ? ?Social Determinants of Health  ? ?Financial Resource Strain: Not on file  ?Food Insecurity: Not on file  ?Transportation Needs: Not on file  ?Physical Activity: Not on file  ?Stress: Not on file  ?Social Connections: Not on file  ?  ? ?Family History: ?[name] family history includes Arthritis  in his brother, father, and mother; Hyperlipidemia in his father. There is no history of Heart attack. ? ?ROS:   ?Please see the history of present illness.    ?Review of Systems  ?Constitutional:  Negative for chills and fever.  ?HENT:  Negative for hearing loss.   ?Eyes:  Negative for blurred vision and redness.  ?Respiratory:  Negative for shortness of breath.   ?Cardiovascular:  Negative for chest pain, palpitations, orthopnea, claudication, leg swelling and PND.  ?Gastrointestinal:  Negative for melena, nausea and vomiting.  ?Genitourinary:  Negative for flank pain.  ?Musculoskeletal:  Negative for falls.   ?Neurological:  Negative for dizziness and loss of consciousness.  ?Endo/Heme/Allergies:  Negative for polydipsia.  ?Psychiatric/Behavioral:  Negative for substance abuse.   ? ?EKGs/Labs/Other Studies Reviewed:   ? ?The following studies were reviewed today: ?TTE 04/22/19: ? 1. Left ventricular ejection fraction, by visual estimation, is 50 to  ?55%. The left ventricle has normal function. Normal left ventricular size.  ?There is no left ventricular hypertrophy.  ? 2. Global right ventricle has normal systolic function.The right  ?ventricular size is normal. No increase in right ventricular wall  ?thickness.  ? 3. Left atrial size was normal.  ? 4. Right atrial size was normal.  ? 5. The inferior vena cava is dilated in size with >50% respiratory  ?variability, suggesting right atrial pressure of 8 mmHg.  ? ?FINDINGS  ? Left Ventricle: Left ventricular ejection fraction, by visual estimation,  ?is 50 to 55%. The left ventricle has normal function. There is no left  ?ventricular hypertrophy. Normal left ventricular size.  ? ?Right Ventricle: The right ventricular size is normal. No increase in  ?right ventricular wall thickness. Global RV systolic function is has  ?normal systolic function. The tricuspid regurgitant velocity is 2.14 m/s,  ?and with an assumed right atrial pressure  ? of 10 mmHg, the estimated right ventricular systolic pressure is normal  ?at 28.3 mmHg.  ? ?Left Atrium: Left atrial size was normal in size.  ? ?Right Atrium: Right atrial size was normal in size  ? ?Pericardium: There is no evidence of pericardial effusion.  ? ?Mitral Valve: The mitral valve is normal in structure. Trace mitral valve  ?regurgitation.  ? ?Tricuspid Valve: The tricuspid valve is normal in structure. Tricuspid  ?valve regurgitation is trivial by color flow Doppler.  ? ?Aortic Valve: The aortic valve is tricuspid. Aortic valve regurgitation  ?was not visualized by color flow Doppler. Mild aortic valve sclerosis is   ?present, with no evidence of aortic valve stenosis.  ? ?Pulmonic Valve: The pulmonic valve was normal in structure. Pulmonic valve  ?regurgitation is not visualized by color flow Doppler.  ? ?Aorta: The aortic root is normal in size and structure.  ? ?Venous: The inferior vena cava is dilated in size with greater than 50%  ?respiratory variability, suggesting right atrial pressure of 8 mmHg.  ? ?IAS/Shunts: No atrial level shunt detected by color flow Doppler.  ? ?Cardiac Monitor 2018: ?Sinus bradycardia to sinus tachycardia. ?Frequent PVCs and bigeminy. ?One run of nsVT that was asymptomatic. ?No atrial fibrillation. ?  ?Continue current medica management. ? ?Coronary CTA 08/2016: ?FINDINGS: ?A 120 kV prospective scan was triggered in the descending thoracic ?aorta at 111 HU's. Axial non-contrast 3 mm slices were carried out ?through the heart. The data set was analyzed on a dedicated work ?station and scored using the [place]. Gantry rotation speed ?was 270 msecs and collimation was .9 mm. 5 mg  of iv Metoprolol and ?0.8 mg of sl NTG was given. The 3D data set was reconstructed in 5% ?intervals of the 67-82 % of the R-R cycle. Diastolic phases were ?analyzed on a dedicated work station using MPR, MIP and VRT modes. ?The patient received 80 cc of contrast. ?  ?Aorta:  Normal size.  No calcifications.  No dissection. ?  ?Aortic Valve:  Trileaflet.  No calcifications. ?  ?Coronary Arteries: Normal coronary origin. Right dominance. This ?study is affected by significant motion in the proximal RCA and LAD ?arteries. There is no plaque in the visualized portions of those ?vessels. ?  ?RCA is a large dominant artery that gives rise to PDA and PLVB. ?  ?LM has no plaque. ?  ?LCX is a non-dominant artery that gives rise to one large OM1 ?branch. There is no plaque. ?  ?Other findings: ?  ?Normal pulmonary vein drainage into the left atrium. ?  ?Normal let atrial appendage without a thrombus. ?  ?Normal size of the  pulmonary artery. ?  ?No ASD/VSD. ?  ?IMPRESSION: ?1. Coronary calcium score of 0. This was 0 percentile for age and ?sex matched control. ?  ?2. Normal coronary origin with right dominance. This study is ?affected by [facility]

## 2021-10-11 ENCOUNTER — Other Ambulatory Visit: Payer: Self-pay

## 2021-10-11 ENCOUNTER — Encounter: Payer: Self-pay | Admitting: Cardiology

## 2021-10-11 ENCOUNTER — Ambulatory Visit: Payer: BC Managed Care – PPO | Admitting: Cardiology

## 2021-10-11 VITALS — BP 118/74 | HR 58 | Ht 72.0 in | Wt 180.0 lb

## 2021-10-11 DIAGNOSIS — I4729 Other ventricular tachycardia: Secondary | ICD-10-CM | POA: Diagnosis not present

## 2021-10-11 DIAGNOSIS — I493 Ventricular premature depolarization: Secondary | ICD-10-CM | POA: Diagnosis not present

## 2021-10-11 DIAGNOSIS — I480 Paroxysmal atrial fibrillation: Secondary | ICD-10-CM | POA: Diagnosis not present

## 2021-10-11 DIAGNOSIS — E785 Hyperlipidemia, unspecified: Secondary | ICD-10-CM

## 2021-10-11 MED ORDER — ROSUVASTATIN CALCIUM 5 MG PO TABS: 5.0000 mg | ORAL_TABLET | ORAL | 5 refills | Status: DC

## 2021-10-11 NOTE — Patient Instructions (Signed)
Medication Instructions:  ? ?Your physician recommends that you continue on your current medications as directed. Please refer to the Current Medication list given to you today. ? ?*If you need a refill on your cardiac medications before your next appointment, please call your pharmacy* ? ? ?Follow-Up: ?At [facility], you and your health needs are our priority.  As part of our continuing mission to provide you with exceptional heart care, we have created designated Provider Care Teams.  These Care Teams include your primary Cardiologist (physician) and Advanced Practice Providers (APPs -  Physician Assistants and Nurse Practitioners) who all work together to provide you with the care you need, when you need it. ? ?We recommend signing up for the patient portal called "MyChart".  Sign up information is provided on this After Visit Summary.  MyChart is used to connect with patients for Virtual Visits (Telemedicine).  Patients are able to view lab/test results, encounter notes, upcoming appointments, etc.  Non-urgent messages can be sent to your provider as well.   ?To learn more about what you can do with MyChart, go to [email or web address].   ? ?Your next appointment:   ?1 year(s) ? ?The format for your next appointment:   ?In Person ? ?Provider:   ?Freada Bergeron, MD { ? ? ? ?

## 2021-10-31 ENCOUNTER — Other Ambulatory Visit: Payer: Self-pay

## 2021-10-31 MED ORDER — ROSUVASTATIN CALCIUM 5 MG PO TABS: 5.0000 mg | ORAL_TABLET | ORAL | 3 refills | Status: DC

## 2021-11-15 ENCOUNTER — Other Ambulatory Visit: Payer: Self-pay | Admitting: Family Medicine

## 2021-11-15 ENCOUNTER — Ambulatory Visit: Payer: Self-pay

## 2021-11-15 DIAGNOSIS — M25512 Pain in left shoulder: Secondary | ICD-10-CM

## 2022-02-14 DIAGNOSIS — E785 Hyperlipidemia, unspecified: Secondary | ICD-10-CM | POA: Diagnosis not present

## 2022-02-14 DIAGNOSIS — Z125 Encounter for screening for malignant neoplasm of prostate: Secondary | ICD-10-CM | POA: Diagnosis not present

## 2022-02-17 DIAGNOSIS — Z0000 Encounter for general adult medical examination without abnormal findings: Secondary | ICD-10-CM | POA: Diagnosis not present

## 2022-03-04 DIAGNOSIS — R82998 Other abnormal findings in urine: Secondary | ICD-10-CM | POA: Diagnosis not present

## 2022-03-04 DIAGNOSIS — I480 Paroxysmal atrial fibrillation: Secondary | ICD-10-CM | POA: Diagnosis not present

## 2022-03-04 DIAGNOSIS — Z1389 Encounter for screening for other disorder: Secondary | ICD-10-CM | POA: Diagnosis not present

## 2022-03-04 DIAGNOSIS — Z0000 Encounter for general adult medical examination without abnormal findings: Secondary | ICD-10-CM | POA: Diagnosis not present

## 2022-03-04 DIAGNOSIS — Z1331 Encounter for screening for depression: Secondary | ICD-10-CM | POA: Diagnosis not present

## 2022-03-24 ENCOUNTER — Telehealth: Payer: Self-pay | Admitting: Physician Assistant

## 2022-03-24 NOTE — Telephone Encounter (Signed)
By answering service.  Patient went into irregular heart rate this morning.  Essentially asymptomatic but feels fatigue.  I have advised to take diltiazem 60 mg.  He can take every 6 hours as needed.  Blood pressure 124/92.  Heart rate 72.  He will call us for office visit if no improvement.  If symptomatic, goes to ER.

## 2022-05-03 NOTE — Progress Notes (Deleted)
Cardiology Office Note:    Date:  05/03/2022   ID:  Julien Girt, DOB [birth year], MRN [id number]  PCP:  Creola Corn, MD   [facility]  Cardiologist:  Meriam Sprague, MD  Advanced Practice Provider:  No care team member to display Electrophysiologist:  None  :[id number]}   Referring MD: Creola Corn, MD    History of Present Illness:    Nathan Harrison is a 56 y.o. male with a hx of PAF, PVCs, NSVT, GERD, former chew tobacco use, hyperlipidemia, and anxiety who was previously followed by Dr. Delton See who now presents to clinic for follow-up.   Per review of the record, the patient underwent ETT in 2014 was normal with above average functional capacity. He was diagnosed with PAF with RVR (HR reported 130 bpm) in 2015 at an urgent care at which time he spontaneously converted to NSR. Since that time he's had rare breakthroughs of palpitations but has not required DCCV. 2D echocardiogram 03/2014 showed EF 55-60%, no RWMA, no significant valvular issues. Coronary CT 08/2016 showed calcium score of zero. Event monitor in 09/2016 showed sinus bradycardia-sinus tach, frequent PVCs/bigeminy (<1%), one 6 beat run NSVT, no atrial fib, average HR 69bpm - medical management recommended.CHADSVASC is zero so he is not on anticoagulation. He previously developed hypotension on standing cardizem so is on PRN diltiazem only.   Was last seen in clinic on 09/2021 where he was doing very well.   Today, ***   Past Medical History:  Diagnosis Date   Allergy    Anxiety    Chews tobacco    NSVT (nonsustained ventricular tachycardia)    PAF (paroxysmal atrial fibrillation) (HCC)    PVC's (premature ventricular contractions)     Past Surgical History:  Procedure Laterality Date   HERNIA REPAIR     WISDOM TOOTH EXTRACTION      Current Medications: No outpatient medications have been marked as taking for the 05/05/22 encounter (Appointment) with Meriam Sprague, MD.      Allergies:   Soy allergy and Tuberculin tests   Social History   Socioeconomic History   Marital status: Married    Spouse name: Not on file   Number of children: Not on file   Years of education: Not on file   Highest education level: Not on file  Occupational History   Not on file  Tobacco Use   Smoking status: Never   Smokeless tobacco: Former    Types: Snuff  Vaping Use   Vaping Use: Never used  Substance and Sexual Activity   Alcohol use: No   Drug use: No   Sexual activity: Yes    Birth control/protection: Condom  Other Topics Concern   Not on file  Social History Narrative   Not on file   Social Determinants of Health   Financial Resource Strain: Not on file  Food Insecurity: Not on file  Transportation Needs: Not on file  Physical Activity: Not on file  Stress: Not on file  Social Connections: Not on file     Family History: [name] family history includes Arthritis in his brother, father, and mother; Hyperlipidemia in his father. There is no history of Heart attack.  ROS:   Please see the history of present illness.    Review of Systems  Constitutional:  Negative for chills and fever.  HENT:  Negative for hearing loss.   Eyes:  Negative for blurred vision and redness.  Respiratory:  Negative for shortness of  breath.   Cardiovascular:  Negative for chest pain, palpitations, orthopnea, claudication, leg swelling and PND.  Gastrointestinal:  Negative for melena, nausea and vomiting.  Genitourinary:  Negative for flank pain.  Musculoskeletal:  Negative for falls.  Neurological:  Negative for dizziness and loss of consciousness.  Endo/Heme/Allergies:  Negative for polydipsia.  Psychiatric/Behavioral:  Negative for substance abuse.     EKGs/Labs/Other Studies Reviewed:    The following studies were reviewed today: TTE April 28, 2019:  1. Left ventricular ejection fraction, by visual estimation, is 50 to  55%. The left ventricle has normal function.  Normal left ventricular size.  There is no left ventricular hypertrophy.   2. Global right ventricle has normal systolic function.The right  ventricular size is normal. No increase in right ventricular wall  thickness.   3. Left atrial size was normal.   4. Right atrial size was normal.   5. The inferior vena cava is dilated in size with >50% respiratory  variability, suggesting right atrial pressure of 8 mmHg.   FINDINGS   Left Ventricle: Left ventricular ejection fraction, by visual estimation,  is 50 to 55%. The left ventricle has normal function. There is no left  ventricular hypertrophy. Normal left ventricular size.   Right Ventricle: The right ventricular size is normal. No increase in  right ventricular wall thickness. Global RV systolic function is has  normal systolic function. The tricuspid regurgitant velocity is 2.14 m/s,  and with an assumed right atrial pressure   of 10 mmHg, the estimated right ventricular systolic pressure is normal  at 28.3 mmHg.   Left Atrium: Left atrial size was normal in size.   Right Atrium: Right atrial size was normal in size   Pericardium: There is no evidence of pericardial effusion.   Mitral Valve: The mitral valve is normal in structure. Trace mitral valve  regurgitation.   Tricuspid Valve: The tricuspid valve is normal in structure. Tricuspid  valve regurgitation is trivial by color flow Doppler.   Aortic Valve: The aortic valve is tricuspid. Aortic valve regurgitation  was not visualized by color flow Doppler. Mild aortic valve sclerosis is  present, with no evidence of aortic valve stenosis.   Pulmonic Valve: The pulmonic valve was normal in structure. Pulmonic valve  regurgitation is not visualized by color flow Doppler.   Aorta: The aortic root is normal in size and structure.   Venous: The inferior vena cava is dilated in size with greater than 50%  respiratory variability, suggesting right atrial pressure of 8 mmHg.    IAS/Shunts: No atrial level shunt detected by color flow Doppler.   Cardiac Monitor 2018: Sinus bradycardia to sinus tachycardia. Frequent PVCs and bigeminy. One run of nsVT that was asymptomatic. No atrial fibrillation.   Continue current medica management.  Coronary CTA 08/2016: FINDINGS: A 120 kV prospective scan was triggered in the descending thoracic aorta at 111 HU's. Axial non-contrast 3 mm slices were carried out through the heart. The data set was analyzed on a dedicated work station and scored using the [place] method. Gantry rotation speed was 270 msecs and collimation was .9 mm. 5 mg of iv Metoprolol and 0.8 mg of sl NTG was given. The 3D data set was reconstructed in 5% intervals of the 67-82 % of the R-R cycle. Diastolic phases were analyzed on a dedicated work station using MPR, MIP and VRT modes. The patient received 80 cc of contrast.   Aorta:  Normal size.  No calcifications.  No dissection.   Aortic  Valve:  Trileaflet.  No calcifications.   Coronary Arteries: Normal coronary origin. Right dominance. This study is affected by significant motion in the proximal RCA and LAD arteries. There is no plaque in the visualized portions of those vessels.   RCA is a large dominant artery that gives rise to PDA and PLVB.   LM has no plaque.   LCX is a non-dominant artery that gives rise to one large OM1 branch. There is no plaque.   Other findings:   Normal pulmonary vein drainage into the left atrium.   Normal let atrial appendage without a thrombus.   Normal size of the pulmonary artery.   No ASD/VSD.   IMPRESSION: 1. Coronary calcium score of 0. This was 0 percentile for age and sex matched control.   2. Normal coronary origin with right dominance. This study is affected by significant motion in the proximal RCA and LAD arteries. There is no plaque in the visualized portions of those vessels. No plaque in LCx artery.   3. Most probably normal  study, however significant motion artifact. Because of that and the fact that the patient is having nsVTs on event monitor a cardiac stress MRI is recommended for further evaluation.  EKG:  No new ECG today.  Recent Labs: No results found for requested labs within last 365 days.  Recent Lipid Panel    Component Value Date/Time   CHOL 107 08/24/2017 0944   TRIG 104 08/24/2017 0944   HDL 44 08/24/2017 0944   CHOLHDL 2.4 08/24/2017 0944   LDLCALC 42 08/24/2017 0944     Risk Assessment/Calculations:    CHA2DS2-VASc Score =    {This indicates a  % annual risk of stroke. [name] score is based upon:         Physical Exam:    VS:  There were no vitals taken for this visit.    Wt Readings from Last 3 Encounters:  10/11/21 180 lb (81.6 kg)  06/26/21 176 lb 3.2 oz (79.9 kg)  11/01/20 174 lb 6.4 oz (79.1 kg)     GEN:  Well nourished, well developed in no acute distress HEENT: Normal NECK: No JVD; No carotid bruits CARDIAC: RRR, no murmurs, rubs, gallops RESPIRATORY:  CTAB ABDOMEN: Soft, non-tender, non-distended MUSCULOSKELETAL:  No edema; No deformity. Warm SKIN: Warm and dry NEUROLOGIC:  Alert and oriented x 3 PSYCHIATRIC:  Normal affect   ASSESSMENT:    No diagnosis found.   PLAN:    In order of problems listed above:  #Paroxysmal Afib:  CHADs-vasc 0. Not on AC. Very rare episodes of palpitations usually in the setting of dehydration that resolve without intervention.  -Continue dilt 60mg  a needed -Not on AC due to CHADs-vasc 0  #PVCs: #NSVT: Asymptomatic with no palpitations. TTE with normal LVEF 50-55%, no significant valve abnormalities. -Continue to monitor  #HLD: LDL 67. Ca score 0 on CT in 2018. -Crestor 5mg  2x/week    Medication Adjustments/Labs and Tests Ordered: Current medicines are reviewed at length with the patient today.  Concerns regarding medicines are outlined above.  No orders of the defined types were placed in this  encounter.  No orders of the defined types were placed in this encounter.   There are no Patient Instructions on file for this visit.     Signed, Meriam Sprague, MD  05/03/2022 1:37 PM    [facility]

## 2022-05-05 ENCOUNTER — Encounter: Payer: Self-pay | Admitting: Cardiology

## 2022-05-05 ENCOUNTER — Ambulatory Visit: Payer: BC Managed Care – PPO | Attending: Cardiology | Admitting: Cardiology

## 2022-05-05 VITALS — BP 124/74 | HR 67 | Ht 72.0 in | Wt 180.0 lb

## 2022-05-05 DIAGNOSIS — I493 Ventricular premature depolarization: Secondary | ICD-10-CM

## 2022-05-05 DIAGNOSIS — I480 Paroxysmal atrial fibrillation: Secondary | ICD-10-CM

## 2022-05-05 DIAGNOSIS — I4729 Other ventricular tachycardia: Secondary | ICD-10-CM | POA: Diagnosis not present

## 2022-05-05 DIAGNOSIS — E785 Hyperlipidemia, unspecified: Secondary | ICD-10-CM | POA: Diagnosis not present

## 2022-05-05 NOTE — Progress Notes (Signed)
Cardiology Office Note:    Date:  05/05/2022   ID:  Cynda Familia, DOB 12/13/1965, MRN [id number]  PCP:  Shon Baton, MD   [place]  Cardiologist:  Freada Bergeron, MD  Advanced Practice Provider:  No care team member to display Electrophysiologist:  None  :[id number]}   Referring MD: Shon Baton, MD    History of Present Illness:    Nathan Harrison is a 56 y.o. male with a hx of PAF, PVCs, NSVT, GERD, former chew tobacco use, hyperlipidemia, and anxiety who was previously followed by Dr. Meda Coffee who now presents to clinic for follow-up.   Per review of the record, the patient underwent ETT in 2014 was normal with above average functional capacity. He was diagnosed with PAF with RVR (HR reported 130 bpm) in 2015 at an urgent care at which time he spontaneously converted to NSR. Since that time he's had rare breakthroughs of palpitations but has not required DCCV. 2D echocardiogram 03/2014 showed EF 55-60%, no RWMA, no significant valvular issues. Coronary CT 08/2016 showed calcium score of zero. Event monitor in 09/2016 showed sinus bradycardia-sinus tach, frequent PVCs/bigeminy (<1%), one 6 beat run NSVT, no atrial fib, average HR 69bpm - medical management recommended.CHADSVASC is zero so he is not on anticoagulation. He previously developed hypotension on standing cardizem so is on PRN diltiazem only.   Was last seen in clinic on 09/2021 where he was doing very well.   Today, he states that he is doing well overall. However, he reports that he is aware when he is in A-fib and he had an episode x1 month ago. At that time, he woke up in the middle of the night with persistent irregular palpitations which he describes as skipped beats. He never has heart rates in the 100s with his A-fib. He called the on-call provider and was advised to take his Cardizem '60mg'$  which he did with relief of his symptoms. He suspects that this occurred as he may have been dehydrated  after a strenuous day at work.  He does have questions regarding vagal maneuvers and whether or not these will relieve his A-fib episodes. He prefers to not take the Cardizem if he can avoid it. He feels that the Cardizem drops his blood pressure too low causing him to feel very poorly afterward.  He has some concerns that his A-fib may be associated with thyroid problems. I provided reassurance that his last TSH was normal.  He was previously performing cardio exercises and weightlifting on the same days 3 days a week. He has since split this routine up so that he is alternating these exercises throughout the week.  No chest pain, SOB, orthopnea or PND.   Past Medical History:  Diagnosis Date   Allergy    Anxiety    Chews tobacco    NSVT (nonsustained ventricular tachycardia) (HCC)    PAF (paroxysmal atrial fibrillation) (HCC)    PVC's (premature ventricular contractions)     Past Surgical History:  Procedure Laterality Date   HERNIA REPAIR     WISDOM TOOTH EXTRACTION      Current Medications: Current Meds  Medication Sig   diltiazem (CARDIZEM) 60 MG tablet Take 1 tablet (60 mg total) by mouth as directed. For A Fib   rosuvastatin (CRESTOR) 5 MG tablet Take 1 tablet (5 mg total) by mouth 2 (two) times a week.     Allergies:   Soy allergy and Tuberculin tests   Social History  Socioeconomic History   Marital status: Married    Spouse name: Not on file   Number of children: Not on file   Years of education: Not on file   Highest education level: Not on file  Occupational History   Not on file  Tobacco Use   Smoking status: Never   Smokeless tobacco: Former    Types: Snuff  Vaping Use   Vaping Use: Never used  Substance and Sexual Activity   Alcohol use: No   Drug use: No   Sexual activity: Yes    Birth control/protection: Condom  Other Topics Concern   Not on file  Social History Narrative   Not on file   Social Determinants of Health   Financial  Resource Strain: Not on file  Food Insecurity: Not on file  Transportation Needs: Not on file  Physical Activity: Not on file  Stress: Not on file  Social Connections: Not on file     Family History: [name] family history includes Arthritis in his brother, father, and mother; Hyperlipidemia in his father. There is no history of Heart attack.  ROS:   Please see the history of present illness.    Review of Systems  Constitutional:  Negative for chills and fever.  HENT:  Negative for hearing loss.   Eyes:  Negative for blurred vision and redness.  Respiratory:  Negative for shortness of breath.   Cardiovascular:  Positive for palpitations. Negative for chest pain, orthopnea, claudication, leg swelling and PND.  Gastrointestinal:  Negative for melena, nausea and vomiting.  Genitourinary:  Negative for flank pain.  Musculoskeletal:  Negative for falls.  Neurological:  Negative for dizziness and loss of consciousness.  Endo/Heme/Allergies:  Negative for polydipsia.  Psychiatric/Behavioral:  Negative for substance abuse.     EKGs/Labs/Other Studies Reviewed:    The following studies were reviewed today: TTE 05/21/19:  1. Left ventricular ejection fraction, by visual estimation, is 50 to  55%. The left ventricle has normal function. Normal left ventricular size.  There is no left ventricular hypertrophy.   2. Global right ventricle has normal systolic function.The right  ventricular size is normal. No increase in right ventricular wall  thickness.   3. Left atrial size was normal.   4. Right atrial size was normal.   5. The inferior vena cava is dilated in size with >50% respiratory  variability, suggesting right atrial pressure of 8 mmHg.   FINDINGS   Left Ventricle: Left ventricular ejection fraction, by visual estimation,  is 50 to 55%. The left ventricle has normal function. There is no left  ventricular hypertrophy. Normal left ventricular size.   Right Ventricle:  The right ventricular size is normal. No increase in  right ventricular wall thickness. Global RV systolic function is has  normal systolic function. The tricuspid regurgitant velocity is 2.14 m/s,  and with an assumed right atrial pressure   of 10 mmHg, the estimated right ventricular systolic pressure is normal  at 28.3 mmHg.   Left Atrium: Left atrial size was normal in size.   Right Atrium: Right atrial size was normal in size   Pericardium: There is no evidence of pericardial effusion.   Mitral Valve: The mitral valve is normal in structure. Trace mitral valve  regurgitation.   Tricuspid Valve: The tricuspid valve is normal in structure. Tricuspid  valve regurgitation is trivial by color flow Doppler.   Aortic Valve: The aortic valve is tricuspid. Aortic valve regurgitation  was not visualized by color flow  Doppler. Mild aortic valve sclerosis is  present, with no evidence of aortic valve stenosis.   Pulmonic Valve: The pulmonic valve was normal in structure. Pulmonic valve  regurgitation is not visualized by color flow Doppler.   Aorta: The aortic root is normal in size and structure.   Venous: The inferior vena cava is dilated in size with greater than 50%  respiratory variability, suggesting right atrial pressure of 8 mmHg.   IAS/Shunts: No atrial level shunt detected by color flow Doppler.   Cardiac Monitor 2018: Sinus bradycardia to sinus tachycardia. Frequent PVCs and bigeminy. One run of nsVT that was asymptomatic. No atrial fibrillation.   Continue current medica management.  Coronary CTA 08/2016: FINDINGS: A 120 kV prospective scan was triggered in the descending thoracic aorta at 111 HU's. Axial non-contrast 3 mm slices were carried out through the heart. The data set was analyzed on a dedicated work station and scored using the [place]. Gantry rotation speed was 270 msecs and collimation was .9 mm. 5 mg of iv Metoprolol and 0.8 mg of sl NTG was  given. The 3D data set was reconstructed in 5% intervals of the 67-82 % of the R-R cycle. Diastolic phases were analyzed on a dedicated work station using MPR, MIP and VRT modes. The patient received 80 cc of contrast.   Aorta:  Normal size.  No calcifications.  No dissection.   Aortic Valve:  Trileaflet.  No calcifications.   Coronary Arteries: Normal coronary origin. Right dominance. This study is affected by significant motion in the proximal RCA and LAD arteries. There is no plaque in the visualized portions of those vessels.   RCA is a large dominant artery that gives rise to PDA and PLVB.   LM has no plaque.   LCX is a non-dominant artery that gives rise to one large OM1 branch. There is no plaque.   Other findings:   Normal pulmonary vein drainage into the left atrium.   Normal let atrial appendage without a thrombus.   Normal size of the pulmonary artery.   No ASD/VSD.   IMPRESSION: 1. Coronary calcium score of 0. This was 0 percentile for age and sex matched control.   2. Normal coronary origin with right dominance. This study is affected by significant motion in the proximal RCA and LAD arteries. There is no plaque in the visualized portions of those vessels. No plaque in LCx artery.   3. Most probably normal study, however significant motion artifact. Because of that and the fact that the patient is having nsVTs on event monitor a cardiac stress MRI is recommended for further evaluation.  EKG:  No new ECG today.  Recent Labs: No results found for requested labs within last 365 days.  Recent Lipid Panel    Component Value Date/Time   CHOL 107 08/24/2017 0944   TRIG 104 08/24/2017 0944   HDL 44 08/24/2017 0944   CHOLHDL 2.4 08/24/2017 0944   LDLCALC 42 08/24/2017 0944     Risk Assessment/Calculations:    CHA2DS2-VASc Score = 0  {This indicates a 0.2% annual risk of stroke. [name] score is based upon: CHF History: 0 HTN History:  0 Diabetes History: 0 Stroke History: 0 Vascular Disease History: 0 Age Score: 0 Gender Score: 0        Physical Exam:    VS:  BP 124/74   Pulse 67   Ht 6' (1.829 m)   Wt 180 lb (81.6 kg)   SpO2 99%  BMI 24.41 kg/m     Wt Readings from Last 3 Encounters:  05/05/22 180 lb (81.6 kg)  10/11/21 180 lb (81.6 kg)  06/26/21 176 lb 3.2 oz (79.9 kg)     GEN:  Well nourished, well developed in no acute distress HEENT: Normal NECK: No JVD; No carotid bruits CARDIAC: RRR, no murmurs, rubs, gallops RESPIRATORY:  CTAB ABDOMEN: Soft, non-tender, non-distended MUSCULOSKELETAL:  Warm, no edema SKIN: Warm and dry NEUROLOGIC:  Alert and oriented x 3 PSYCHIATRIC:  Normal affect   ASSESSMENT:    1. PAF (paroxysmal atrial fibrillation) ([place])   2. PVC's (premature ventricular contractions)   3. NSVT (nonsustained ventricular tachycardia) ([place])   4. Hyperlipidemia, unspecified hyperlipidemia type      PLAN:    In order of problems listed above:  #Paroxysmal Afib:  CHADs-vasc 0. Not on AC. Very rare episodes of palpitations usually in the setting of dehydration that resolve without intervention.  -Continue dilt '60mg'$  a needed; discussed he can take '30mg'$  daily if needed -Not on AC due to CHADs-vasc 0  #PVCs: #NSVT: Asymptomatic with no palpitations. TTE with normal LVEF 50-55%, no significant valve abnormalities. -Continue to monitor  #HLD: LDL 42. Ca score 0 on CT in 2018. -Crestor '5mg'$  2x/week    Medication Adjustments/Labs and Tests Ordered: Current medicines are reviewed at length with the patient today.  Concerns regarding medicines are outlined above.  No orders of the defined types were placed in this encounter.  No orders of the defined types were placed in this encounter.   Patient Instructions  Medication Instructions:   Your physician recommends that you continue on your current medications as directed. Please refer to the Current Medication list given to  you today.  *If you need a refill on your cardiac medications before your next appointment, please call your pharmacy*   Follow-Up: At [facility], you and your health needs are our priority.  As part of our continuing mission to provide you with exceptional heart care, we have created designated Provider Care Teams.  These Care Teams include your primary Cardiologist (physician) and Advanced Practice Providers (APPs -  Physician Assistants and Nurse Practitioners) who all work together to provide you with the care you need, when you need it.  We recommend signing up for the patient portal called "MyChart".  Sign up information is provided on this After Visit Summary.  MyChart is used to connect with patients for Virtual Visits (Telemedicine).  Patients are able to view lab/test results, encounter notes, upcoming appointments, etc.  Non-urgent messages can be sent to your provider as well.   To learn more about what you can do with MyChart, go to [email or web address].    Your next appointment:   1 year(s)  The format for your next appointment:   In Person  Provider:   Freada Bergeron, MD     Important Information About [place] as a scribe for Freada Bergeron, MD.,have documented all relevant documentation on the behalf of Freada Bergeron, MD,as directed by  Freada Bergeron, MD while in the presence of Freada Bergeron, MD.  I, Freada Bergeron, MD, have reviewed all documentation for this visit. The documentation on 05/05/22 for the exam, diagnosis, procedures, and orders are all accurate and complete.   Signed, Freada Bergeron, MD  05/05/2022 9:05 AM    [place]

## 2022-05-05 NOTE — Patient Instructions (Signed)
Medication Instructions:   Your physician recommends that you continue on your current medications as directed. Please refer to the Current Medication list given to you today.  *If you need a refill on your cardiac medications before your next appointment, please call your pharmacy*    Follow-Up: At [facility], you and your health needs are our priority.  As part of our continuing mission to provide you with exceptional heart care, we have created designated Provider Care Teams.  These Care Teams include your primary Cardiologist (physician) and Advanced Practice Providers (APPs -  Physician Assistants and Nurse Practitioners) who all work together to provide you with the care you need, when you need it.  We recommend signing up for the patient portal called "MyChart".  Sign up information is provided on this After Visit Summary.  MyChart is used to connect with patients for Virtual Visits (Telemedicine).  Patients are able to view lab/test results, encounter notes, upcoming appointments, etc.  Non-urgent messages can be sent to your provider as well.   To learn more about what you can do with MyChart, go to [email or web address].    Your next appointment:   1 year(s)  The format for your next appointment:   In Person  Provider:   Heather E Pemberton, MD       Important Information About Sugar       

## 2022-12-23 DIAGNOSIS — D229 Melanocytic nevi, unspecified: Secondary | ICD-10-CM | POA: Diagnosis not present

## 2022-12-23 DIAGNOSIS — L720 Epidermal cyst: Secondary | ICD-10-CM | POA: Diagnosis not present

## 2022-12-23 DIAGNOSIS — D225 Melanocytic nevi of trunk: Secondary | ICD-10-CM | POA: Diagnosis not present

## 2022-12-23 DIAGNOSIS — L700 Acne vulgaris: Secondary | ICD-10-CM | POA: Diagnosis not present

## 2023-01-02 ENCOUNTER — Other Ambulatory Visit: Payer: Self-pay

## 2023-01-02 MED ORDER — ROSUVASTATIN CALCIUM 5 MG PO TABS: 5.0000 mg | ORAL_TABLET | ORAL | 1 refills | Status: DC

## 2023-02-02 DIAGNOSIS — R042 Hemoptysis: Secondary | ICD-10-CM | POA: Diagnosis not present

## 2023-02-16 ENCOUNTER — Other Ambulatory Visit: Payer: Self-pay | Admitting: Internal Medicine

## 2023-02-16 DIAGNOSIS — R042 Hemoptysis: Secondary | ICD-10-CM

## 2023-02-18 ENCOUNTER — Ambulatory Visit
Admission: RE | Admit: 2023-02-18 | Discharge: 2023-02-18 | Disposition: A | Discharge status: Home / Self Care | Payer: BC Managed Care – PPO | Source: Ambulatory Visit | Attending: Internal Medicine | Admitting: Internal Medicine

## 2023-02-18 DIAGNOSIS — R042 Hemoptysis: Secondary | ICD-10-CM | POA: Diagnosis not present

## 2023-02-18 DIAGNOSIS — R070 Pain in throat: Secondary | ICD-10-CM | POA: Diagnosis not present

## 2023-02-18 MED ORDER — IOPAMIDOL (ISOVUE-370) INJECTION 76%
75.0000 mL | Freq: Once | INTRAVENOUS | Status: AC | PRN
  Administered 2023-02-18: 75 mL via INTRAVENOUS

## 2023-02-24 DIAGNOSIS — R042 Hemoptysis: Secondary | ICD-10-CM | POA: Diagnosis not present

## 2023-03-10 DIAGNOSIS — L720 Epidermal cyst: Secondary | ICD-10-CM | POA: Diagnosis not present

## 2023-03-20 DIAGNOSIS — Z1212 Encounter for screening for malignant neoplasm of rectum: Secondary | ICD-10-CM | POA: Diagnosis not present

## 2023-03-24 DIAGNOSIS — E785 Hyperlipidemia, unspecified: Secondary | ICD-10-CM | POA: Diagnosis not present

## 2023-03-24 DIAGNOSIS — R351 Nocturia: Secondary | ICD-10-CM | POA: Diagnosis not present

## 2023-03-26 ENCOUNTER — Other Ambulatory Visit: Payer: Self-pay

## 2023-03-26 MED ORDER — ROSUVASTATIN CALCIUM 5 MG PO TABS: 5.0000 mg | ORAL_TABLET | ORAL | 1 refills | Status: DC

## 2023-03-27 DIAGNOSIS — Z0000 Encounter for general adult medical examination without abnormal findings: Secondary | ICD-10-CM | POA: Diagnosis not present

## 2023-03-27 DIAGNOSIS — R82998 Other abnormal findings in urine: Secondary | ICD-10-CM | POA: Diagnosis not present

## 2023-03-27 DIAGNOSIS — I480 Paroxysmal atrial fibrillation: Secondary | ICD-10-CM | POA: Diagnosis not present

## 2023-03-27 DIAGNOSIS — Z1331 Encounter for screening for depression: Secondary | ICD-10-CM | POA: Diagnosis not present

## 2023-04-01 DIAGNOSIS — H43312 Vitreous membranes and strands, left eye: Secondary | ICD-10-CM | POA: Diagnosis not present

## 2023-04-01 DIAGNOSIS — H2512 Age-related nuclear cataract, left eye: Secondary | ICD-10-CM | POA: Diagnosis not present

## 2023-04-22 DIAGNOSIS — H2512 Age-related nuclear cataract, left eye: Secondary | ICD-10-CM | POA: Diagnosis not present

## 2023-04-22 DIAGNOSIS — H43312 Vitreous membranes and strands, left eye: Secondary | ICD-10-CM | POA: Diagnosis not present

## 2023-06-15 ENCOUNTER — Ambulatory Visit: Payer: BC Managed Care – PPO | Attending: Cardiology | Admitting: Cardiology

## 2023-06-15 ENCOUNTER — Encounter: Payer: Self-pay | Admitting: Cardiology

## 2023-06-15 VITALS — BP 102/64 | HR 64 | Ht 72.0 in | Wt 180.4 lb

## 2023-06-15 DIAGNOSIS — I480 Paroxysmal atrial fibrillation: Secondary | ICD-10-CM | POA: Diagnosis not present

## 2023-06-15 DIAGNOSIS — I4729 Other ventricular tachycardia: Secondary | ICD-10-CM

## 2023-06-15 DIAGNOSIS — E785 Hyperlipidemia, unspecified: Secondary | ICD-10-CM | POA: Diagnosis not present

## 2023-06-15 DIAGNOSIS — I493 Ventricular premature depolarization: Secondary | ICD-10-CM

## 2023-06-15 NOTE — Patient Instructions (Signed)
Medication Instructions:  Your physician recommends that you continue on your current medications as directed. Please refer to the Current Medication list given to you today.  *If you need a refill on your cardiac medications before your next appointment, please call your pharmacy*  Lab Work: None ordered today. If you have labs (blood work) drawn today and your tests are completely normal, you will receive your results only by: MyChart Message (if you have MyChart) OR A paper copy in the mail If you have any lab test that is abnormal or we need to change your treatment, we will call you to review the results.  Testing/Procedures: Your physician has requested that you have an echocardiogram. Echocardiography is a painless test that uses sound waves to create images of your heart. It provides your doctor with information about the size and shape of your heart and how well your heart's chambers and valves are working. This procedure takes approximately one hour. There are no restrictions for this procedure. Please do NOT wear cologne, perfume, aftershave, or lotions (deodorant is allowed). Please arrive 15 minutes prior to your appointment time.  Please note: We ask at that you not bring children with you during ultrasound (echo/ vascular) testing. Due to room size and safety concerns, children are not allowed in the ultrasound rooms during exams. Our front office staff cannot provide observation of children in our lobby area while testing is being conducted. An adult accompanying a patient to their appointment will only be allowed in the ultrasound room at the discretion of the ultrasound technician under special circumstances. We apologize for any inconvenience.  Your physician has requested that you have a coronary calcium score performed. This is not covered by insurance and will be an out-of-pocket cost of approximately $99.   Follow-Up: At [facility], you and your health needs are our  priority.  As part of our continuing mission to provide you with exceptional heart care, we have created designated Provider Care Teams.  These Care Teams include your primary Cardiologist (physician) and Advanced Practice Providers (APPs -  Physician Assistants and Nurse Practitioners) who all work together to provide you with the care you need, when you need it.  We recommend signing up for the patient portal called "MyChart".  Sign up information is provided on this After Visit Summary.  MyChart is used to connect with patients for Virtual Visits (Telemedicine).  Patients are able to view lab/test results, encounter notes, upcoming appointments, etc.  Non-urgent messages can be sent to your provider as well.   To learn more about what you can do with MyChart, go to [email or web address].    Your next appointment:   1 year(s)  The format for your next appointment:   In Person  Provider:   Tessa Lerner, DO {

## 2023-06-15 NOTE — Progress Notes (Signed)
Cardiology Office Note:    Date:  06/15/2023  NAME:  Nathan Harrison    MRN: [id number] DOB:  [birth year]   PCP:  Creola Corn, MD  Former Cardiology Providers: Dr. Delton See, Dr. Shari Prows Primary Cardiologist:  Tessa Lerner, DO, [facility] (established care 06/15/2023) Electrophysiologist:  None   Chief Complaint  Patient presents with   Follow-up    1 year follow-up-paroxysmal atrial fibrillation/PVCs    History of Present Illness:    Nathan Harrison is a 57 y.o. Caucasian male whose past medical history and cardiovascular risk factors includes: Paroxysmal atrial fibrillation, premature ventricular contractions, NSVT, GERD, former chewing tobacco, hyperlipidemia.   Formally under the care of Dr. Shari Prows who last saw Julien Girt back in October 2023. I am seeing him for the first time to re-establishing care.   Diagnosed with paroxysmal atrial fibrillation with rapid ventricular rate in 2015 and spontaneously converted to normal sinus rhythm.  Patient has not required direct-current cardioversion or atrial fibrillation ablation or antiarrhythmic medications for his PAF.  He has been monitored overall on an annual basis.  In the past he did undergo cardiovascular workup including echocardiogram, coronary CTA, and an event monitor.  He was given Cardizem to use for rate control strategy but was later discontinued due to hypotension.  Not on anticoagulation given his CHA2DS2-VASc score.  Since last office visit he is doing well from a cardiovascular standpoint.  No reoccurrence of his of atrial fibrillation.  Patient is able to appreciate when he goes in and out of A-fib.  His last episode of A-fib subjectively was in 2023.  Patient questions the need to be on statin therapy.  He is currently on rosuvastatin 5 mg twice a week.  Current Medications: Current Meds  Medication Sig   diltiazem (CARDIZEM) 60 MG tablet Take 1 tablet (60 mg total) by mouth as directed. For A Fib    rosuvastatin (CRESTOR) 5 MG tablet Take 1 tablet (5 mg total) by mouth 2 (two) times a week. Pt stated he will schedule appt with provider for further refills. Thank you.     Allergies:    Soy allergy and Tuberculin tests   Past Medical History: Past Medical History:  Diagnosis Date   Allergy    Anxiety    Chews tobacco    NSVT (nonsustained ventricular tachycardia) (HCC)    PAF (paroxysmal atrial fibrillation) (HCC)    PVC's (premature ventricular contractions)     Past Surgical History: Past Surgical History:  Procedure Laterality Date   HERNIA REPAIR     WISDOM TOOTH EXTRACTION      Social History: Social History   Tobacco Use   Smoking status: Never   Smokeless tobacco: Former    Types: Snuff  Vaping Use   Vaping status: Never Used  Substance Use Topics   Alcohol use: No   Drug use: No    Family History: Family History  Problem Relation Age of Onset   Arthritis Mother    Arthritis Father    Hyperlipidemia Father    Arthritis Brother    Heart attack Neg Hx     ROS:   Review of Systems  Cardiovascular:  Negative for chest pain, claudication, irregular heartbeat, leg swelling, near-syncope, orthopnea, palpitations, paroxysmal nocturnal dyspnea and syncope.  Respiratory:  Negative for shortness of breath.   Hematologic/Lymphatic: Negative for bleeding problem.    EKGs/Labs/Other Studies Reviewed:   EKG: EKG Interpretation Date/Time:  Monday June 15 2023 15:29:15 EST Text Interpretation: Normal sinus rhythm No  significant change since last tracing Confirmed by Tessa Lerner [phone]) on 06/15/2023 3:37:06 PM  Echocardiogram: October 2020: LVEF 50-55%, see report for additional details  Coronary CTA: February 2018 Total coronary calcium score is 0. Normal coronary origin with right dominance. See report for additional details  Click Here to Calculate/Change CHADS2VASc Score [name] CHADS2-VASc score is 0, indicating a 0.2% annual risk of  stroke.   CHF History: No HTN History: No Diabetes History: No Stroke History: No Vascular Disease History: No   Labs:    Latest Ref Rng & Units 04/24/2018   12:44 PM 07/31/2016   11:20 AM 04/05/2014   11:05 AM  CBC  WBC 4.0 - 10.5 K/uL 4.8  6.2  7.6   Hemoglobin 13.0 - 17.0 g/dL 13.0  86.5  78.4   Hematocrit 39.0 - 52.0 % 42.7  46.7  47.0   Platelets 150 - 400 K/uL 152  249  226.0        Latest Ref Rng & Units 04/24/2018   12:44 PM 08/24/2017    9:44 AM 10/06/2016    8:58 AM  BMP  Glucose 70 - 99 mg/dL 696  63  93   BUN 6 - 20 mg/dL 19  14  12    Creatinine 0.61 - 1.24 mg/dL 2.95  2.84  1.32   BUN/Creat Ratio 9 - 20  13  12    Sodium 135 - 145 mmol/L 137  143  143   Potassium 3.5 - 5.1 mmol/L 4.0  4.9  5.5   Chloride 98 - 111 mmol/L 103  104  103   CO2 22 - 32 mmol/L 27  24  25    Calcium 8.9 - 10.3 mg/dL 8.7  9.3  9.5       Latest Ref Rng & Units 04/24/2018   12:44 PM 08/24/2017    9:44 AM 10/06/2016    8:58 AM  CMP  Glucose 70 - 99 mg/dL 440  63  93   BUN 6 - 20 mg/dL 19  14  12    Creatinine 0.61 - 1.24 mg/dL 1.02  7.25  3.66   Sodium 135 - 145 mmol/L 137  143  143   Potassium 3.5 - 5.1 mmol/L 4.0  4.9  5.5   Chloride 98 - 111 mmol/L 103  104  103   CO2 22 - 32 mmol/L 27  24  25    Calcium 8.9 - 10.3 mg/dL 8.7  9.3  9.5   Total Protein 6.0 - 8.5 g/dL  6.8  6.5   Total Bilirubin 0.0 - 1.2 mg/dL  0.3  0.4   Alkaline Phos 39 - 117 IU/L  77  80   AST 0 - 40 IU/L  23  19   ALT 0 - 44 IU/L  27  23     Lab Results  Component Value Date   CHOL 107 08/24/2017   HDL 44 08/24/2017   LDLCALC 42 08/24/2017   TRIG 104 08/24/2017   CHOLHDL 2.4 08/24/2017   03/2023 - TC 138/TG 86/HDL 40/LDL 81 - Tol 2x per week  10/4032 - TC 130/TG 58/HDL 51/LDL 67  01/2021 - TC 131/TG 79/HDL 41/LDL 74   Physical Exam:    Today's Vitals   06/15/23 1526  BP: 102/64  Pulse: 64  SpO2: 97%  Weight: 180 lb 6.4 oz (81.8 kg)  Height: 6' (1.829 m)   Body mass index is 24.47 kg/m. Wt Readings  from Last 3 Encounters:  06/15/23 180 lb  6.4 oz (81.8 kg)  05/05/22 180 lb (81.6 kg)  10/11/21 180 lb (81.6 kg)    Physical Exam  Constitutional: No distress.  hemodynamically stable  Neck: No JVD present.  Cardiovascular: Normal rate, regular rhythm, S1 normal and S2 normal. Exam reveals no gallop, no S3 and no S4.  No murmur heard. Pulmonary/Chest: Effort normal and breath sounds normal. No stridor. He has no wheezes. He has no rales.  Abdominal: Soft. Bowel sounds are normal. He exhibits no distension. There is no abdominal tenderness.  Musculoskeletal:        General: No edema.     Cervical back: Neck supple.  Neurological: He is alert and oriented to person, place, and time. He has intact cranial nerves (2-12).  Skin: Skin is warm.   Impression & Recommendation(s):  Impression:   ICD-10-CM   1. PAF (paroxysmal atrial fibrillation) (HCC)  I48.0 EKG 12-Lead    ECHOCARDIOGRAM COMPLETE    2. PVC's (premature ventricular contractions)  I49.3     3. NSVT (nonsustained ventricular tachycardia) (HCC)  I47.29     4. Hyperlipidemia, unspecified hyperlipidemia type  E78.5 CT CARDIAC SCORING (SELF PAY ONLY)       Recommendation(s):  PAF (paroxysmal atrial fibrillation) (HCC) Diagnosed in 2015. CHA2DS2-VASc 0 EKG sinus rhythm No reoccurrence of A-fib over the last 1 year according to the patient Has not done well with AV nodal blocking agents for rate control strategy in the past. His precipitant factor is dehydration. Monitor clinically for now. Patient is aware that if he develops other comorbidities that would likely change his CHA2DS2-VASc score and will need to be discussed antiplatelet/anticoagulation.  PVC's (premature ventricular contractions) NSVT (nonsustained ventricular tachycardia) (HCC) Remains asymptomatic. Echo will be ordered to evaluate for structural heart disease and left ventricular systolic function. Monitor clinically  Hyperlipidemia, unspecified  hyperlipidemia type Placed on Crestor 5 mg p.o. 2 times per week He questions the need of statin therapy. Patient states that he was placed on cholesterol medication in the past due to elevated LDL levels.  Since then he has implemented significant amount of lifestyle changes. His ASCVD risk score is less than 5%. Last coronary calcium score as of 2018 is 0. Shared decision was to repeat coronary calcium scoring if his total CAC is 0 we will hold off on statin therapy. He is encouraged on the importance of having it rechecked at least annually and to eat a heart healthy diet and manage his risk factors.   Orders Placed:  Orders Placed This Encounter  Procedures   CT CARDIAC SCORING (SELF PAY ONLY)    Standing Status:   Future    Standing Expiration Date:   06/14/2024    Order Specific Question:   Preferred imaging location?    Answer:   [facility]   EKG 12-Lead   ECHOCARDIOGRAM COMPLETE    Standing Status:   Future    Standing Expiration Date:   06/14/2024    Order Specific Question:   Where should this test be performed    Answer:   [facility] [place] [facility])    Order Specific Question:   Does the patient weigh less than or greater than 250 lbs?    Answer:   Patient weighs less than 250 lbs    Order Specific Question:   Perflutren DEFINITY (image enhancing agent) should be administered unless hypersensitivity or allergy exist    Answer:   Administer Perflutren    Order Specific Question:   Reason for exam-Echo  Answer:   Other-Full Diagnosis List    Order Specific Question:   Full ICD-10/Reason for Exam    Answer:   PAF (paroxysmal atrial fibrillation) (HCC) [098119]    As part of medical decision making results of the office note from Dr. Shari Prows May 05, 2022, Care Everywhere results from September 2024, EKG, prior coronary CTA results were reviewed independently at today's visit.   Final Medication List:   No orders of the defined types were placed  in this encounter.   There are no discontinued medications.   Current Outpatient Medications:    diltiazem (CARDIZEM) 60 MG tablet, Take 1 tablet (60 mg total) by mouth as directed. For A Fib, Disp: 30 tablet, Rfl: 6   rosuvastatin (CRESTOR) 5 MG tablet, Take 1 tablet (5 mg total) by mouth 2 (two) times a week. Pt stated he will schedule appt with provider for further refills. Thank you., Disp: 25 tablet, Rfl: 1  Consent:   NA  Disposition:   1 year follow up.  Patient may be asked to follow-up sooner based on the results of the above-mentioned testing.  His questions and concerns were addressed to his satisfaction. He voices understanding of the recommendations provided during this encounter.    Signed, Tessa Lerner, DO, [facility]  [facility]  [address] [place], [place] [address] 06/15/2023 4:55 PM

## 2023-06-29 ENCOUNTER — Other Ambulatory Visit: Payer: Self-pay | Admitting: Nurse Practitioner

## 2023-07-02 ENCOUNTER — Ambulatory Visit (HOSPITAL_BASED_OUTPATIENT_CLINIC_OR_DEPARTMENT_OTHER)
Admission: RE | Admit: 2023-07-02 | Discharge: 2023-07-02 | Disposition: A | Discharge status: Home / Self Care | Payer: BC Managed Care – PPO | Source: Ambulatory Visit | Attending: Cardiology | Admitting: Cardiology

## 2023-07-02 DIAGNOSIS — E785 Hyperlipidemia, unspecified: Secondary | ICD-10-CM | POA: Insufficient documentation

## 2023-07-10 ENCOUNTER — Telehealth: Payer: Self-pay

## 2023-07-10 NOTE — Telephone Encounter (Signed)
Pt asked if it would be okay to discontinue his Rosuvastatin (Crestor) after reviewing his coronary calcium score results. Dr. Odis Hollingshead stated that because the pt's CAC is zero and ASCVD score is 5%, it would be okay to discontinue the Crestor. Pt is aware of this and has no questions. Pt's medication list has been updated.

## 2023-07-14 ENCOUNTER — Telehealth: Payer: Self-pay | Admitting: Cardiology

## 2023-07-14 ENCOUNTER — Telehealth: Payer: Self-pay | Admitting: Internal Medicine

## 2023-07-14 NOTE — Telephone Encounter (Signed)
Patient c/o Palpitations:  STAT if patient reporting lightheadedness, shortness of breath, or chest pain  How long have you had palpitations/irregular HR/ Afib? Are you having the symptoms now? Last night between 1-3 hours  Are you currently experiencing lightheadedness, SOB or CP? No   Do you have a history of afib (atrial fibrillation) or irregular heart rhythm? Yes   Have you checked your BP or HR? (document readings if available): 125/84; 74  Are you experiencing any other symptoms? Frequent urination

## 2023-07-14 NOTE — Telephone Encounter (Signed)
Paged. Patient having some irregular heart beats but BP and pulse are normal. He said it feels like previous episodes of AF. His C2V is 0. He is having no other symptoms. He wonders if he should take diltiazem PRN. I recommended he talk to his cardiologist in the am since his BP and pulse are normal.

## 2023-07-14 NOTE — Telephone Encounter (Signed)
Pt called and stated that at 2 AM he woke up to go to the bathroom and felt like he was having palpitations. He took his HR and it was 56 and then he checked again and it was 70. Pt denied any CP, SOB, N/V. As of 5 AM he stated everything was back to normal. Pt stated that last time he had an episode like this was about 2 years ago and he does have a hx of afib (last noted to be in afib 2015). Explained to pt that per Dr. Tenny Craw (DOD 12/24), if pt has another episode that is prolonged, he would need to go and get evaluated. Please refer to Dr. Emelda Brothers last OV note from 11/25 discussing this pt's PAF. Pt advised to go to urgent care or ED to get evaluated if another one of these episodes comes on, if CP begins, he experiences SOB, etc. Pt verbalized understanding and had no further questions at this time.

## 2023-09-28 ENCOUNTER — Other Ambulatory Visit: Payer: Self-pay | Admitting: Nurse Practitioner

## 2024-01-10 ENCOUNTER — Telehealth: Payer: Self-pay | Admitting: Cardiology

## 2024-01-10 MED ORDER — DILTIAZEM HCL 60 MG PO TABS: 60.0000 mg | ORAL_TABLET | ORAL | 6 refills | Status: AC

## 2024-01-10 NOTE — Telephone Encounter (Signed)
 Outpatient service line:  Patient with prior history of paroxysmal atrial fibrillation with reportedly very low burden.  He is not on anticoagulation given a CHA2DS2-VASc of 0.  He is calling reporting a rare occurrence of palpitations and has requested a refill on his diltiazem  60 mg which I will be providing.  Additionally I have scheduled him follow-up July 16 for further evaluation at his request.  Otherwise no other significant concerns such as chest pain, shortness of breath, peripheral edema.

## 2024-02-02 NOTE — Progress Notes (Unsigned)
 Cardiology Office Note    Date:  02/03/2024  ID:  Elsie Cozier, DOB 10/22/1965, MRN [id number] PCP:  Onita Rush, MD  Cardiologist:  Madonna Large, DO  Electrophysiologist:  None   Chief Complaint: palpitations  History of Present Illness: .    Nathan Harrison is a 58 y.o. male with visit-pertinent history of PAF, <1% PVCs and 1 brief NSVT, GERD, former chew tobacco use, hyperlipidemia, anxiety, minimal aortic atherosclerosis by CT 06/2023 who presents for 1 year follow-up.    ETT in 2014 was normal with above average functional capacity. He was diagnosed with PAF with RVR (HR reported 130 bpm) in 2015 at an urgent care at which time he spontaneously converted to NSR. Since that time he's had rare breakthroughs of palpitations but has not required DCCV. Coronary CT 08/2016 showed calcium  score of zero. Event monitor 09/2016 showed sinus brady-sinus tach, PVCs/bigeminy (<1%), one 6 beat run NSVT, no atrial fib, average HR 69bpm - medical management recommended.CHADSVASC historically zero so he is not on anticoagulation. He previously developed hypotension on standing Cartia  so is on PRN diltiazem  only. Repeat calcium  score 06/2023 CAC 0, minimal aortic atherosclerosis. Patient preferred to limit statin rx if possible so rosuvastatin  subsequently discontinued given CAC 0. Last echo 2020 EF 50-55%, no LVH, dilated IVC. Repeat echo ordered 05/2023, not yet completed.  He is seen for recent evaluation of palpitations. He had his first episode in a long time in June. They started up one morning without specific provocation, more noticeable lying on left side. It felt irregular but not fast. He called the answering service and got refill of PRN diltiazem  since his was expired. He picked it up at the pharmacy, took his first dose, and symptoms eased within 1 hour - total duration about 2 hours. He thinks in the past that depleted electrolytes or lack of sleep have triggered episodes. 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  Labwork independently reviewed: KPN 03/2023 LDL 81, trig 86, HDL 40, K 4.1, ALT wnl, TSH wnl 2019 Mg 2.3, CBC, K 4.0, Cr 1.0, LDL 42, trig 104, LFTs nl 7981 TSH wnl  ROS: .    Please see the history of present illness.  All other systems are reviewed and otherwise negative.  Studies Reviewed: SABRA    EKG:  EKG is ordered today, personally reviewed, demonstrating EKG Interpretation Date/Time:  Wednesday February 03 2024 10:23:59 EDT Ventricular Rate:  62 PR Interval:  160 QRS Duration:  92 QT Interval:  396 QTC Calculation: 401 R Axis:   64  Text Interpretation: Normal sinus rhythm Normal ECG When compared with ECG of 15-Jun-2023 15:29, No significant change was found Confirmed by Nayomi Tabron [phone]) on 02/03/2024 10:30:44 AM    CV Studies: Cardiac studies reviewed are outlined and summarized above. Otherwise please see EMR for full report.   Current Reported Medications:.    Current Meds  Medication Sig   diltiazem  (CARDIZEM ) 60 MG tablet Take 1 tablet (60 mg total) by mouth as directed. For A Fib    Physical Exam:    VS:  BP 116/66   Pulse 66   Ht 6' (1.829 m)   Wt 176 lb 6.4 oz (80 kg)   SpO2 98%   BMI 23.92 kg/m    Wt Readings from Last 3 Encounters:  02/03/24 176 lb 6.4 oz (80 kg)  06/15/23 180 lb 6.4 oz (81.8 kg)  05/05/22 180 lb (81.6 kg)    GEN: Well  nourished, well developed in no acute distress NECK: No JVD; No carotid bruits CARDIAC: RRR, no murmurs, rubs, gallops RESPIRATORY:  Clear to auscultation without rales, wheezing or rhonchi  ABDOMEN: Soft, non-tender, non-distended EXTREMITIES:  No edema; No acute deformity   Asessement and Plan:.    1. Palpitations with history of PAF, brief NSVT, PVCs with prior bigeminy (low burden on monitor in 2018) - I suspect based on description he had an episode of ectopy rather than atrial fib. When he had his transient episode of atrial fib in 2015, his heart rate was fast.  With this episode it was irregular but not tachycardic. It resolved with PRN diltiazem . He has not had enough burden of symptoms to warrant standing maintenance therapy; this previously dropped his blood pressure. The last episode otherwise was at least over a year ago. Given low burden of symptoms, Zio unlikely to be helpful. I strongly encouraged him to get a KardiaMobile device so that he can record an EKG next time symptoms arise. We also discussed use of pulse ox as well to track heart rate. He inquired about whether he needs a pacemaker or the likelihood of needing to see EP in the future. We discussed that PPM is indicated for low heart rates, not specifically irregular heartbeats, so there is no indication he is heading in that direction at this time. Given extremely low burden of episodes (2 hr in 1 year), no indication to refer to EP but I would be happy to do so if he begins developing trouble with increase in burden. Will check basic labs today and update echocardiogram. He will notify us  to review the KardiaMobile next time an episode is captured. He has enough diltiazem  PRN at home and does not need a refill at this time.  2. Hyperlipidemia, minimal aortic atherosclerosis - at prior OV, patient questioned need for statin therapy. Given CAC 0, statin was discontinued. There was otherwise minimal aortic atherosclerosis on CT. Cholesterol is now being followed/managed by PCP.   Disposition: F/u with Dr. Michele in 6 months. (OK to move from Nov to Jan. given today's OV.)  Signed, Raphael LOISE Bring, PA-C

## 2024-02-03 ENCOUNTER — Ambulatory Visit: Attending: Physician Assistant | Admitting: Physician Assistant

## 2024-02-03 ENCOUNTER — Encounter: Payer: Self-pay | Admitting: Physician Assistant

## 2024-02-03 VITALS — BP 116/66 | HR 66 | Ht 72.0 in | Wt 176.4 lb

## 2024-02-03 DIAGNOSIS — R002 Palpitations: Secondary | ICD-10-CM | POA: Diagnosis not present

## 2024-02-03 DIAGNOSIS — I4729 Other ventricular tachycardia: Secondary | ICD-10-CM | POA: Diagnosis not present

## 2024-02-03 DIAGNOSIS — I480 Paroxysmal atrial fibrillation: Secondary | ICD-10-CM

## 2024-02-03 DIAGNOSIS — I493 Ventricular premature depolarization: Secondary | ICD-10-CM

## 2024-02-03 DIAGNOSIS — E7849 Other hyperlipidemia: Secondary | ICD-10-CM | POA: Diagnosis not present

## 2024-02-03 NOTE — Patient Instructions (Addendum)
 Medication Instructions:  No changes *If you need a refill on your cardiac medications before your next appointment, please call your pharmacy*  Lab Work: Today we are going to draw a Bmet, Mag, CBC, and TSH If you have labs (blood work) drawn today and your tests are completely normal, you will receive your results only by: MyChart Message (if you have MyChart) OR A paper copy in the mail If you have any lab test that is abnormal or we need to change your treatment, we will call you to review the results.  Testing/Procedures: Your physician has requested that you have an echocardiogram. Echocardiography is a painless test that uses sound waves to create images of your heart. It provides your doctor with information about the size and shape of your heart and how well your heart's chambers and valves are working. This procedure takes approximately one hour. There are no restrictions for this procedure. Please do NOT wear cologne, perfume, aftershave, or lotions (deodorant is allowed). Please arrive 15 minutes prior to your appointment time.  Please note: We ask at that you not bring children with you during ultrasound (echo/ vascular) testing. Due to room size and safety concerns, children are not allowed in the ultrasound rooms during exams. Our front office staff cannot provide observation of children in our lobby area while testing is being conducted. An adult accompanying a patient to their appointment will only be allowed in the ultrasound room at the discretion of the ultrasound technician under special circumstances. We apologize for any inconvenience.  Follow-Up: At [facility], you and your health needs are our priority.  As part of our continuing mission to provide you with exceptional heart care, our providers are all part of one team.  This team includes your primary Cardiologist (physician) and Advanced Practice Providers or APPs (Physician Assistants and Nurse Practitioners)  who all work together to provide you with the care you need, when you need it.  Your next appointment:   6 month(s)  Provider:   Madonna Large, DO or Dayna Dunn, PA-C  We recommend signing up for the patient portal called MyChart.  Sign up information is provided on this After Visit Summary.  MyChart is used to connect with patients for Virtual Visits (Telemedicine).  Patients are able to view lab/test results, encounter notes, upcoming appointments, etc.  Non-urgent messages can be sent to your provider as well.   To learn more about what you can do with MyChart, go to [email or web address].   Other Instructions I strongly encourage you to purchase a KardiaMobile device to have on hand for recurrent palpitations. Do not bother getting the 6L device. The standard one or the credit-card shaped one should suffice.

## 2024-02-04 ENCOUNTER — Ambulatory Visit: Payer: Self-pay | Admitting: Physician Assistant

## 2024-02-04 ENCOUNTER — Encounter: Payer: Self-pay | Admitting: Physician Assistant

## 2024-02-04 LAB — BASIC METABOLIC PANEL WITH GFR
BUN/Creatinine Ratio: 25 — AB (ref 9–20)
BUN: 23 mg/dL (ref 6–24)
CO2: 24 mmol/L (ref 20–29)
Calcium: 9.4 mg/dL (ref 8.7–10.2)
Chloride: 105 mmol/L (ref 96–106)
Creatinine, Ser: 0.91 mg/dL (ref 0.76–1.27)
Glucose: 79 mg/dL (ref 70–99)
Potassium: 5 mmol/L (ref 3.5–5.2)
Sodium: 141 mmol/L (ref 134–144)
eGFR: 98 mL/min/1.73 (ref >59)

## 2024-02-04 LAB — CBC
Hematocrit: 40.7 % (ref 37.5–51.0)
Hemoglobin: 15.2 g/dL (ref 13.0–17.7)
MCH: 38.4 pg — ABNORMAL HIGH (ref 26.6–33.0)
MCHC: 37.3 g/dL — ABNORMAL HIGH (ref 31.5–35.7)
MCV: 103 fL — ABNORMAL HIGH (ref 79–97)
Platelets: 169 x10E3/uL (ref 150–450)
RBC: 3.96 x10E6/uL — ABNORMAL LOW (ref 4.14–5.80)
RDW: 12.3 % (ref 11.6–15.4)
WBC: 4.8 x10E3/uL (ref 3.4–10.8)

## 2024-02-04 LAB — MAGNESIUM: Magnesium: 2.4 mg/dL — AB (ref 1.6–2.3)

## 2024-02-04 LAB — TSH: TSH: 3.16 u[IU]/mL (ref 0.450–4.500)

## 2024-02-04 NOTE — Telephone Encounter (Signed)
 error

## 2024-02-05 ENCOUNTER — Telehealth: Payer: Self-pay | Admitting: Physician Assistant

## 2024-02-05 NOTE — Addendum Note (Signed)
 Addended by: MEMORY DELON POUR on: 02/05/2024 03:39 PM   Modules accepted: Orders

## 2024-02-05 NOTE — Telephone Encounter (Signed)
 Returned call to pt.  He has been made aware that we have sent the order for the Echocardiogram to [facility].  Pt was very appreciative of the help.   He was given fax # for them to fwd the results back to.

## 2024-02-05 NOTE — Telephone Encounter (Signed)
 Spoke to pt's wife, she states the echo will be much more affordable at [facility] since it is not affiliated with a hospital so she is trying to get this test scheduled there. She needs a referral noting test only to be able to have it done there. Pt will still need to see their provider for the test but the referral is necessary. Please advise.   Referral to be faxed as follows:   Fax - [phone] Attn Erminio

## 2024-02-05 NOTE — Telephone Encounter (Signed)
 Pt's wife would like a c/b regarding pt being able to have Echo and and any other future test done at another location due to insurance reasons. If possible pt wants Echo done at [facility] on [address].Please advise  Fax - [phone] Attn Erminio

## 2024-02-08 NOTE — Telephone Encounter (Signed)
 Spoke to pt's wife, Sharyle to relay Laymon Qua PA-C's feedback as stated. I faxed the referral to attn: Erminio [phone] as requested previously by pt's wife, made her aware this was done. She will also call the [facility] location to check on pricing there as well. Nothing further needed at this time.

## 2024-03-14 ENCOUNTER — Other Ambulatory Visit (HOSPITAL_COMMUNITY)

## 2024-03-28 ENCOUNTER — Encounter: Payer: Self-pay | Admitting: Cardiology

## 2024-03-28 ENCOUNTER — Ambulatory Visit: Attending: Physician Assistant

## 2024-03-28 ENCOUNTER — Telehealth: Payer: Self-pay | Admitting: Cardiology

## 2024-03-28 DIAGNOSIS — I480 Paroxysmal atrial fibrillation: Secondary | ICD-10-CM

## 2024-03-28 NOTE — Telephone Encounter (Signed)
 Routing back to triage since [facility] ORN is out today. Discussed case with Dr. Michele. Given that patient woke up in AFib, we don't know the full duration. This can make it tricky when evaluating stroke risk.    We would recommend the patient go ahead and start Eliquis 5 mg BID so that if he remains in atrial fibrillation, we have a leg up on options for treatment sooner rather than later. Would encourage to monitor for any abnormal bleeding like blood in stool, urine or dark stools. Do not take with NSAIDS.   Additional recommendations: - please have him schedule the echocardiogram we ordered in July, order should still be active - order 2 week non-live Zio to evaluate true AF burden including during sleep - refer to electrophysiology for further strategies to try to keep him out of afib - if he develops any low BP or rapid rates, needs to go to ER. We can consider a maintenance dose of low dose Toprol going forward depending on HR/BP trends, can keep us  posted. Previously his BP did not tolerate standing diltiazem .   Woke up at 7ish this morning and noticed afib. (He reports that he did get up through the night to use bathroom and did not notice afib) Gave the information above. He is very reluctant to start Eliquis, seeing how he only has afib every now and then and is out of afib at this time after taking Diltiazem  60mg . 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 He gets scrapes and cuts weekly and often works by himself.  Has to get Echo scheduled at [facility] facility- due to insurance cost- they are working on it. They will work on getting appointment for Echo.  Sending the Zio Monitor- told to wear it for 2 weeks. Informed that someone from EP department will call to schedule an appt.   He also wanted some clarification of how often he can take the diltiazem  60 mg- is it every 6 hours as needed for afib?  Sent Zio  monitor instructions through MyChart.

## 2024-03-28 NOTE — Telephone Encounter (Signed)
 Routing back to triage since [facility] ORN is out today. Discussed case with Dr. Michele. Given that patient woke up in AFib, we don't know the full duration. This can make it tricky when evaluating stroke risk.   We would recommend the patient go ahead and start Eliquis 5 mg BID so that if he remains in atrial fibrillation, we have a leg up on options for treatment sooner rather than later. Would encourage to monitor for any abnormal bleeding like blood in stool, urine or dark stools. Do not take with NSAIDS.  Additional recommendations: - please have him schedule the echocardiogram we ordered in July, order should still be active - order 2 week non-live Zio to evaluate true AF burden including during sleep - refer to electrophysiology for further strategies to try to keep him out of afib - if he develops any low BP or rapid rates, needs to go to ER. We can consider a maintenance dose of low dose Toprol going forward depending on HR/BP trends, can keep us  posted. Previously his BP did not tolerate standing diltiazem .

## 2024-03-28 NOTE — Telephone Encounter (Signed)
 Spoke with the patient who states that he woke up this morning in afib. He reports that he can tell that his heart is beating irregularly. He states that he does feel a bit tired but otherwise feels fine. His blood pressure and heart rate are normal. He took diltiazem  60 mg about 5-10 minutes ago. He does have a [employer] that he used to get a recording. He states that if he is able to get into his MyChart he will send us  over the reading. Advised patient to let us  know if he remains in afib for an extended period of time, if he develops any symptoms, or if heart rate increases. Patient is not currently taking a blood thinner.

## 2024-03-28 NOTE — Telephone Encounter (Signed)
 Patient c/o Palpitations: STAT if patient c/o lightheadedness, shortness of breath, or chest pain  How long have you had palpitations/irregular HR/ Afib? Are you having the symptoms now? yes  Are you currently experiencing lightheadedness, SOB or CP? no  Do you have a history of afib (atrial fibrillation) or irregular heart rhythm?    Have you checked your BP or HR? (document readings if available): 117/77 HR 62  Are you experiencing any other symptoms?

## 2024-03-28 NOTE — Progress Notes (Unsigned)
 Enrolled for [employer] to mail a ZIO XT long term holter monitor to the patients address on file.   Dr. Odis Hollingshead to read.

## 2024-03-29 DIAGNOSIS — R946 Abnormal results of thyroid function studies: Secondary | ICD-10-CM | POA: Diagnosis not present

## 2024-03-29 DIAGNOSIS — Z0189 Encounter for other specified special examinations: Secondary | ICD-10-CM | POA: Diagnosis not present

## 2024-03-29 DIAGNOSIS — E785 Hyperlipidemia, unspecified: Secondary | ICD-10-CM | POA: Diagnosis not present

## 2024-03-29 NOTE — Telephone Encounter (Signed)
Sent pt via mychart

## 2024-03-29 NOTE — Telephone Encounter (Signed)
 Per other mychart message, patient relayed he went back into NSR, glad to hear!  OK to hold off Eliquis since duration was short, but still keep plan for Zio and EP f/u to discuss additional options going forward as there are other ways to help manage the breakthrough events (the Zio will also help us  evaluate if he is having what we call subclinical afib perhaps during sleeping hours as well). In the future, OK to repeat the as needed dose of diltiazem  every 8 hours given that dose, as long as systolic blood pressure remains above 110 or HR >70.

## 2024-03-29 NOTE — Telephone Encounter (Signed)
 Noted - see other phone note from today. Glad to hear returned to NSR.

## 2024-04-05 DIAGNOSIS — R82998 Other abnormal findings in urine: Secondary | ICD-10-CM | POA: Diagnosis not present

## 2024-04-05 DIAGNOSIS — Z1331 Encounter for screening for depression: Secondary | ICD-10-CM | POA: Diagnosis not present

## 2024-04-05 DIAGNOSIS — Z23 Encounter for immunization: Secondary | ICD-10-CM | POA: Diagnosis not present

## 2024-04-05 DIAGNOSIS — R946 Abnormal results of thyroid function studies: Secondary | ICD-10-CM | POA: Diagnosis not present

## 2024-04-05 DIAGNOSIS — Z0189 Encounter for other specified special examinations: Secondary | ICD-10-CM | POA: Diagnosis not present

## 2024-04-05 DIAGNOSIS — I480 Paroxysmal atrial fibrillation: Secondary | ICD-10-CM | POA: Diagnosis not present

## 2024-04-05 DIAGNOSIS — Z0000 Encounter for general adult medical examination without abnormal findings: Secondary | ICD-10-CM | POA: Diagnosis not present

## 2024-05-01 DIAGNOSIS — I480 Paroxysmal atrial fibrillation: Secondary | ICD-10-CM

## 2024-05-02 ENCOUNTER — Ambulatory Visit: Attending: Cardiovascular Disease | Admitting: Cardiovascular Disease

## 2024-05-02 ENCOUNTER — Encounter: Payer: Self-pay | Admitting: Cardiovascular Disease

## 2024-05-02 ENCOUNTER — Ambulatory Visit: Payer: Self-pay | Admitting: Physician Assistant

## 2024-05-02 VITALS — BP 124/70 | HR 66 | Ht 72.0 in | Wt 165.0 lb

## 2024-05-02 DIAGNOSIS — I493 Ventricular premature depolarization: Secondary | ICD-10-CM | POA: Diagnosis not present

## 2024-05-02 DIAGNOSIS — I480 Paroxysmal atrial fibrillation: Secondary | ICD-10-CM

## 2024-05-02 DIAGNOSIS — I4729 Other ventricular tachycardia: Secondary | ICD-10-CM | POA: Diagnosis not present

## 2024-05-02 DIAGNOSIS — R002 Palpitations: Secondary | ICD-10-CM | POA: Diagnosis not present

## 2024-05-02 NOTE — Patient Instructions (Signed)
 Medication Instructions:  Your physician recommends that you continue on your current medications as directed. Please refer to the Current Medication list given to you today.  *If you need a refill on your cardiac medications before your next appointment, please call your pharmacy*  Lab Work: None ordered.  If you have labs (blood work) drawn today and your tests are completely normal, you will receive your results only by: MyChart Message (if you have MyChart) OR A paper copy in the mail If you have any lab test that is abnormal or we need to change your treatment, we will call you to review the results.  Testing/Procedures: Your physician has recommended that you have an ablation. Catheter ablation is a medical procedure used to treat some cardiac arrhythmias (irregular heartbeats). During catheter ablation, a long, thin, flexible tube is put into a blood vessel in your groin (upper thigh), or neck. This tube is called an ablation catheter. It is then guided to your heart through the blood vessel. Radio frequency waves destroy small areas of heart tissue where abnormal heartbeats may cause an arrhythmia to start. Please see the instruction sheet given to you today.'  Follow-Up: At [facility], you and your health needs are our priority.  As part of our continuing mission to provide you with exceptional heart care, our providers are all part of one team.  This team includes your primary Cardiologist (physician) and Advanced Practice Providers or APPs (Physician Assistants and Nurse Practitioners) who all work together to provide you with the care you need, when you need it.  Your next appointment:   Please call or send a MyChart message when and if you decide to move forward with the ablation.

## 2024-05-02 NOTE — Progress Notes (Signed)
 Electrophysiology Office Note:    Date:  05/02/2024   ID:  Nathan Harrison, DOB 1965-08-16, MRN [id number]  PCP:  Onita Rush, MD   [facility] Providers Cardiologist:  Madonna Large, DO     Referring MD: Abigail Raphael SAILOR, PA-C   History of Present Illness:    Erbie Arment is a 58 y.o. male with a medical history significant for paroxysmal atrial fibrillation, rare PVCs, hyperlipidemia, aortic atherosclerosis by CT referred for arrhythmia management.      History of Present Illness   He was diagnosed with paroxysmal atrial fibrillation with RVR in 2015 while at an urgent care clinic.  He spontaneously converted to normal sinus rhythm.  He has had paroxysms of palpitations since that time, occurring about twice a year.  He notes that episodes oftentimes follow physically demanding days, sometimes associated with dehydration.  Episodes are occurring at the same frequency, but tend to last longer recently.  He called in complaining of palpitations March 28, 2024. He send then 2 images of atrial fibrillation as detected by his [place] mobile app.  The first image from approximately 7:30 in the morning shows atrial fibrillation.  He takes diltiazem  as needed for episodes.  He did not tolerate scheduled diltiazem  due to lightheadedness in the past  He subsequently wore a monitor that showed sinus rhythm with a heart rate of about 82 bpm with frequent PVCs, correlating with resolution of his palpitations     Today, he reports that he feels well and has no complaints  EKGs/Labs/Other Studies Reviewed Today:     Echocardiogram:  TTE  ordered   Monitors:  13 day monitor September 2025-- my interpretation Heart rate 40 to 235 bpm, average 63 bpm.  Episodes of SVT occurred, the longest was 10 beats (3.9 seconds).  These appear to be atrial runs. No symptoms reported.  Ectopy burden less than 1%   EKG:   EKG Interpretation Date/Time:  Monday May 02 2024 15:12:09  EDT Ventricular Rate:  66 PR Interval:  150 QRS Duration:  84 QT Interval:  372 QTC Calculation: 389 R Axis:   75  Text Interpretation: Normal sinus rhythm Normal ECG When compared with ECG of 03-Feb-2024 10:23, No significant change was found Confirmed by Nancey Scotts [phone]) on 05/02/2024 3:31:11 PM     Physical Exam:    VS:  BP 124/70 (BP Location: Right Arm, Patient Position: Sitting, Cuff Size: Large)   Pulse 66   Ht 6' (1.829 m)   Wt 165 lb (74.8 kg)   SpO2 98%   BMI 22.38 kg/m     Wt Readings from Last 3 Encounters:  05/02/24 165 lb (74.8 kg)  02/03/24 176 lb 6.4 oz (80 kg)  06/15/23 180 lb 6.4 oz (81.8 kg)     GEN: Well nourished, well developed in no acute distress CARDIAC: RRR, no murmurs, rubs, gallops RESPIRATORY:  Normal work of breathing MUSCULOSKELETAL: no edema    ASSESSMENT & PLAN:     Paroxysmal atrial fibrillation Documented on Kardiamobile strip from 03/28/2024 He is symptomatic with palpitations and a feeling of uneasiness Episodes are quite rare, occurring typically twice a year Stress and overexertion are often triggers We discussed management options including use of a low-dose beta-blocker with ongoing observation due to his low burden or an early invasive strategy.  He would prefer more aggressive intervention.  I described the ablation procedure, the logistics and risks which include but are not limited to vascular injury, organ injury, perforation resulting in  prolonged hospital stay, need for surgery, or death.  He would like to discuss with his wife prior to deciding whether to proceed or not.  Secondary hypercoagulable state CHA2DS2-VASc is 0 He would need to be on a blood thinner in the perioperative period.   Signed, Eulas FORBES Furbish, MD  05/02/2024 3:31 PM    [facility]

## 2024-06-29 ENCOUNTER — Encounter: Payer: Self-pay | Admitting: Cardiology
